# Patient Record
Sex: Female | Born: 1970 | Race: Black or African American | Hispanic: No | Marital: Married | Smoking: Never smoker
Health system: Southern US, Community
[De-identification: ages and names within clinical notes are randomized; demographics above are authoritative.]

---

## 2010-09-04 ENCOUNTER — Ambulatory Visit: Payer: Self-pay | Admitting: Internal Medicine

## 2015-07-03 DIAGNOSIS — E0500 Thyrotoxicosis with diffuse goiter without thyrotoxic crisis or storm: Secondary | ICD-10-CM

## 2015-07-03 HISTORY — DX: Thyrotoxicosis with diffuse goiter without thyrotoxic crisis or storm: E05.00

## 2019-05-13 ENCOUNTER — Other Ambulatory Visit: Payer: Self-pay | Admitting: Family Medicine

## 2019-05-13 DIAGNOSIS — N6320 Unspecified lump in the left breast, unspecified quadrant: Secondary | ICD-10-CM

## 2019-05-14 ENCOUNTER — Other Ambulatory Visit: Payer: Self-pay | Admitting: Family Medicine

## 2019-05-14 DIAGNOSIS — N6320 Unspecified lump in the left breast, unspecified quadrant: Secondary | ICD-10-CM

## 2019-06-01 ENCOUNTER — Ambulatory Visit
Admission: RE | Admit: 2019-06-01 | Discharge: 2019-06-01 | Disposition: A | Discharge status: Home / Self Care | Payer: BC Managed Care – PPO | Source: Ambulatory Visit | Attending: Family Medicine | Admitting: Family Medicine

## 2019-06-01 DIAGNOSIS — N6320 Unspecified lump in the left breast, unspecified quadrant: Secondary | ICD-10-CM

## 2020-08-19 ENCOUNTER — Other Ambulatory Visit: Payer: Self-pay | Admitting: Infectious Diseases

## 2020-08-19 DIAGNOSIS — Z1231 Encounter for screening mammogram for malignant neoplasm of breast: Secondary | ICD-10-CM

## 2020-09-09 ENCOUNTER — Other Ambulatory Visit: Payer: Self-pay

## 2020-09-09 ENCOUNTER — Ambulatory Visit
Admission: RE | Admit: 2020-09-09 | Discharge: 2020-09-09 | Disposition: A | Discharge status: Home / Self Care | Payer: BC Managed Care – PPO | Source: Ambulatory Visit | Attending: Infectious Diseases | Admitting: Infectious Diseases

## 2020-09-09 DIAGNOSIS — Z1231 Encounter for screening mammogram for malignant neoplasm of breast: Secondary | ICD-10-CM | POA: Insufficient documentation

## 2020-11-09 ENCOUNTER — Other Ambulatory Visit: Payer: Self-pay | Admitting: Infectious Diseases

## 2020-11-09 DIAGNOSIS — D509 Iron deficiency anemia, unspecified: Secondary | ICD-10-CM

## 2020-11-09 DIAGNOSIS — R197 Diarrhea, unspecified: Secondary | ICD-10-CM

## 2020-11-14 ENCOUNTER — Other Ambulatory Visit: Payer: Self-pay

## 2020-11-14 ENCOUNTER — Ambulatory Visit
Admission: RE | Admit: 2020-11-14 | Discharge: 2020-11-14 | Disposition: A | Discharge status: Home / Self Care | Payer: BC Managed Care – PPO | Source: Ambulatory Visit | Attending: Infectious Diseases | Admitting: Infectious Diseases

## 2020-11-14 DIAGNOSIS — D509 Iron deficiency anemia, unspecified: Secondary | ICD-10-CM | POA: Insufficient documentation

## 2020-11-14 DIAGNOSIS — R197 Diarrhea, unspecified: Secondary | ICD-10-CM | POA: Diagnosis not present

## 2020-11-14 MED ORDER — IOHEXOL 300 MG/ML  SOLN
80.0000 mL | Freq: Once | INTRAMUSCULAR | Status: AC | PRN
  Administered 2020-11-14: 80 mL via INTRAVENOUS

## 2021-01-03 IMAGING — US US BREAST*R* LIMITED INC AXILLA
1 series · 6 of 6 positions shown · non-contrast
Comparison: None.

CLINICAL DATA: Patient complains of palpable abnormalities in both
breast.

EXAM:
DIGITAL DIAGNOSTIC BILATERAL MAMMOGRAM WITH CAD AND TOMO
ULTRASOUND BILATERAL BREAST

[Series 1: us breast*right* limited inc axilla · 0.05mm/px · 6 of 6 slices shown]
[im 1/6]
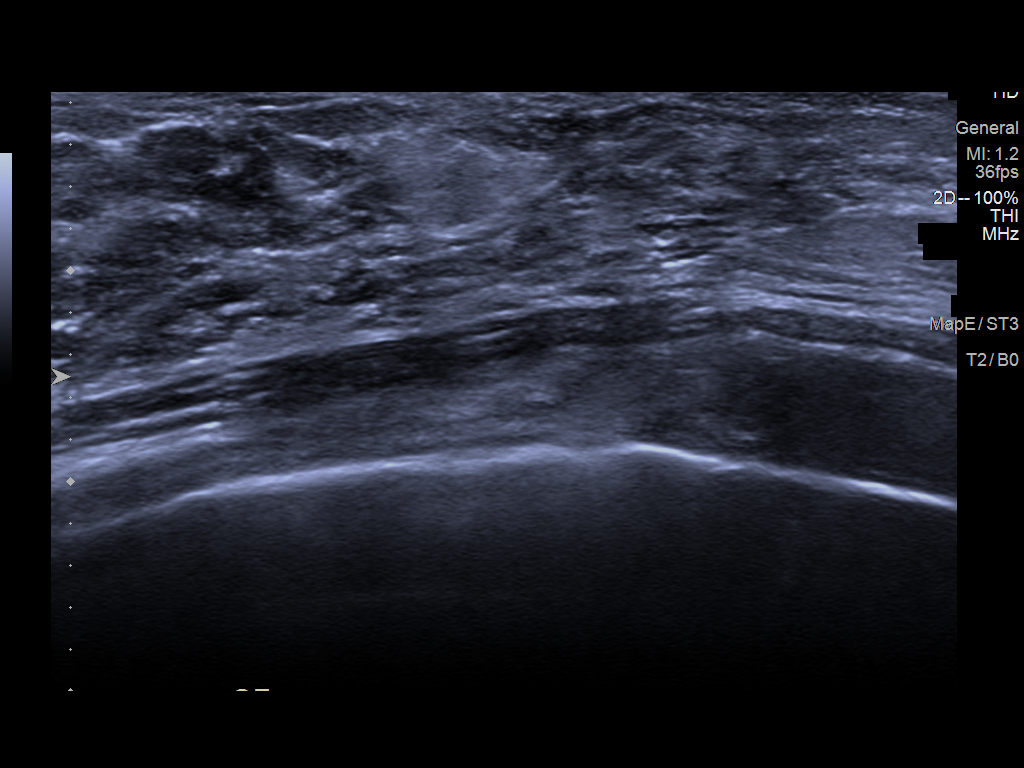
[im 2/6]
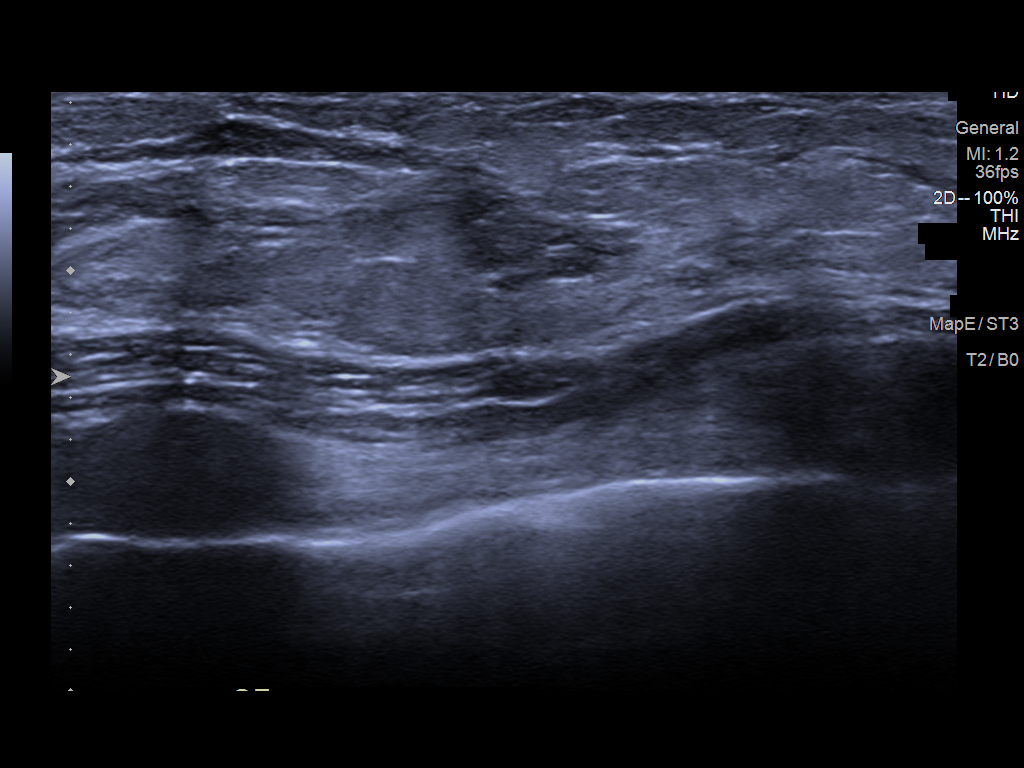
[im 3/6]
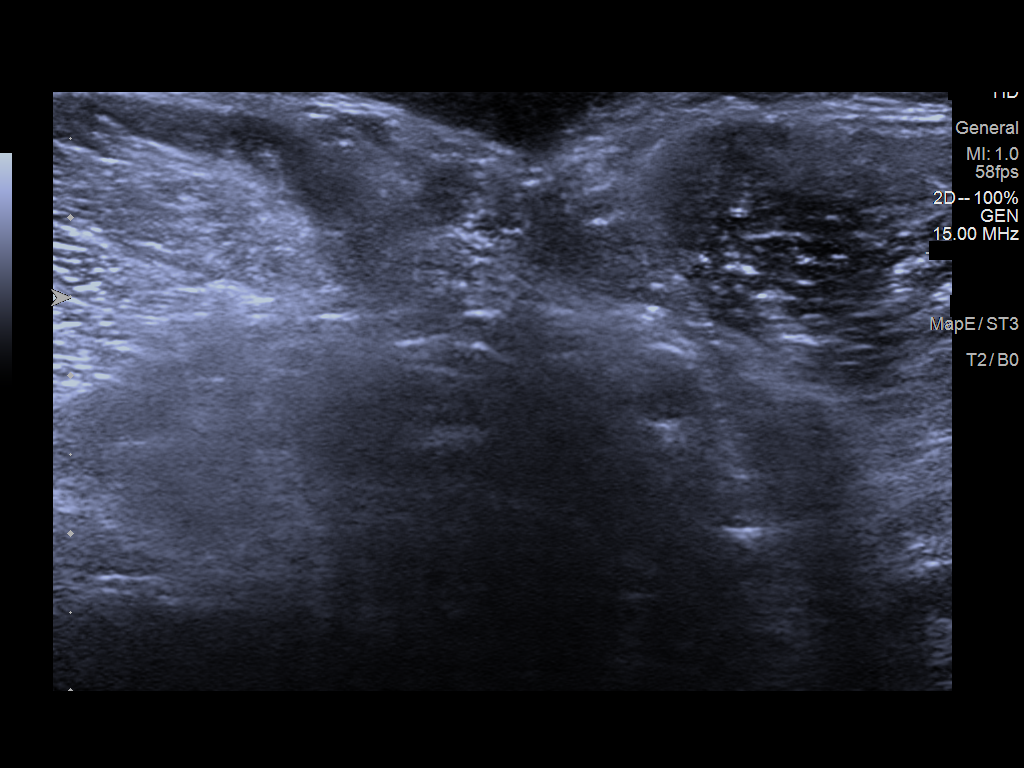
[im 4/6]
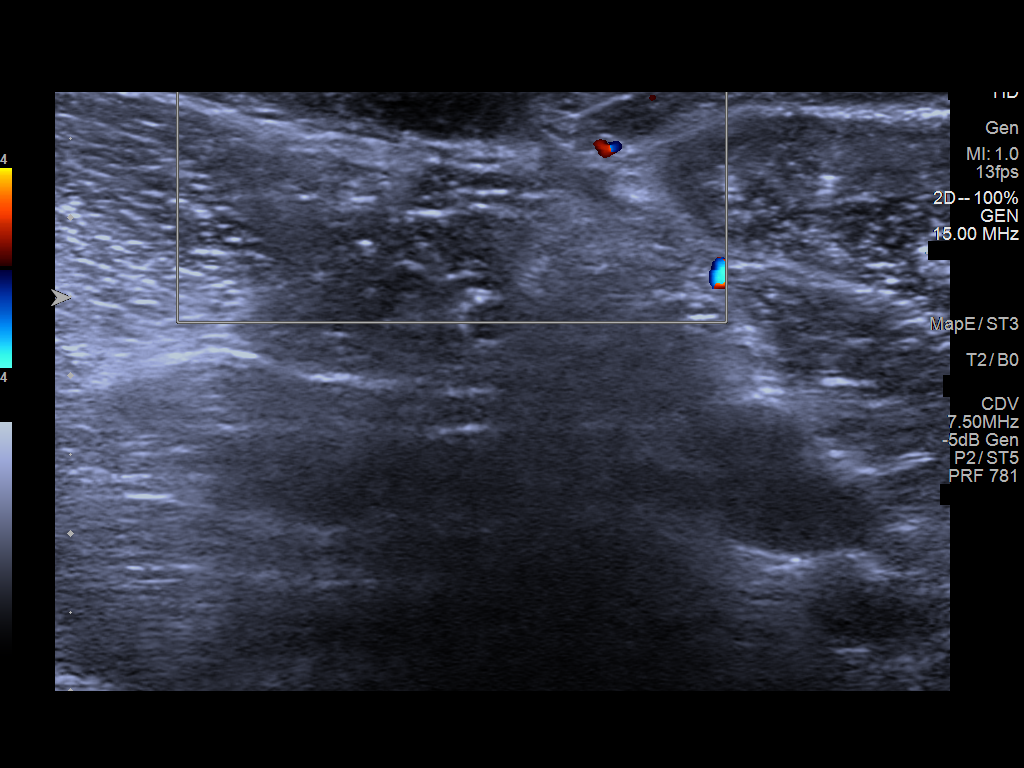
[im 5/6]
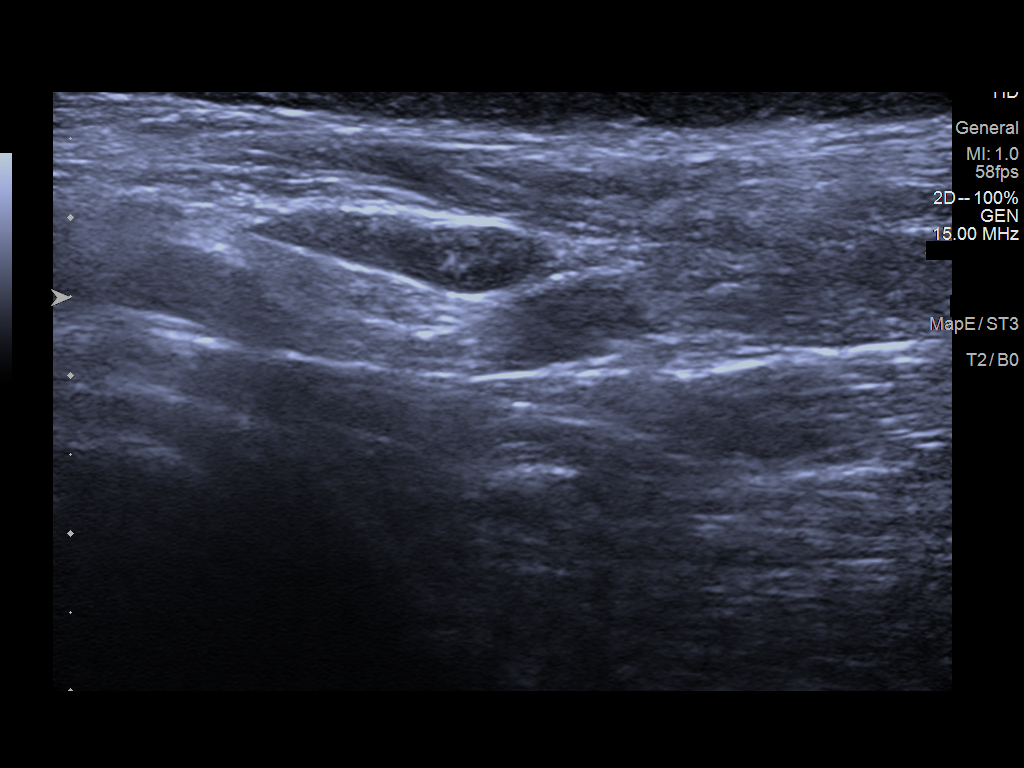
[im 6/6]
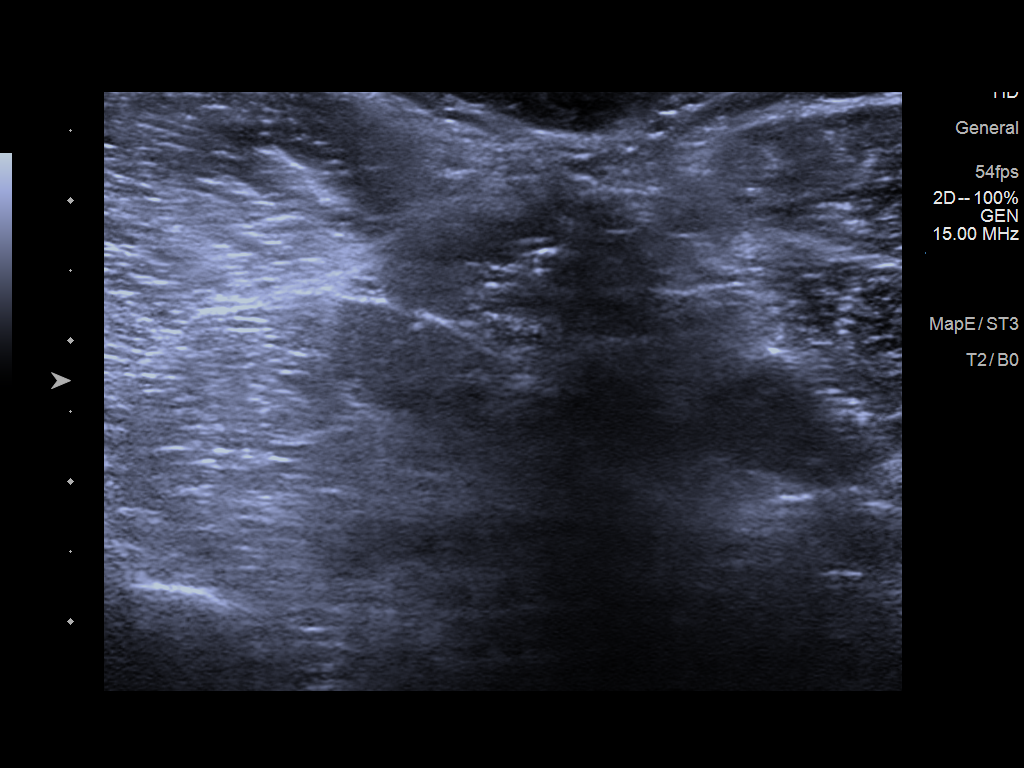

[6 of 6 positions shown; findings below may reference images not displayed]

ACR Breast Density Category d: The breast tissue is extremely dense,
which lowers the sensitivity of mammography.
FINDINGS: There is focal asymmetry in the right axilla seen on the MLO view.
Spot tangential view of this area shows no suspicious mass in the
right axilla. No suspicious mass or malignant type
microcalcifications seen in either breast.

Mammographic images were processed with CAD.

On physical exam, the patient has a fat pad in the right axilla. No
palpable mass is felt. I do not palpate a mass in the area of
clinical concern in the 4 o'clock region of the right breast or the
2 o'clock region of the left breast.

Targeted ultrasound is performed, showing normal tissue in the right
axilla. There is no enlarged adenopathy. Normal tissue is also seen
in the 4 o'clock region of the right breast 6 cm from the nipple and
in the left breast at 2 o'clock 4 cm from the nipple.
IMPRESSION: No evidence of malignancy in either breast.

RECOMMENDATION:
Bilateral screening mammogram in 1 year is recommended.

I have discussed the findings and recommendations with the patient.
If applicable, a reminder letter will be sent to the patient
regarding the next appointment.

BI-RADS CATEGORY  1: Negative.

## 2021-01-03 IMAGING — MG DIGITAL DIAGNOSTIC BILAT W/ TOMO W/ CAD
8 of 14 series · 8 of 40 positions shown · non-contrast
Comparison: None.

CLINICAL DATA: Patient complains of palpable abnormalities in both
breast.

EXAM:
DIGITAL DIAGNOSTIC BILATERAL MAMMOGRAM WITH CAD AND TOMO
ULTRASOUND BILATERAL BREAST

[L TAN synth-2D]
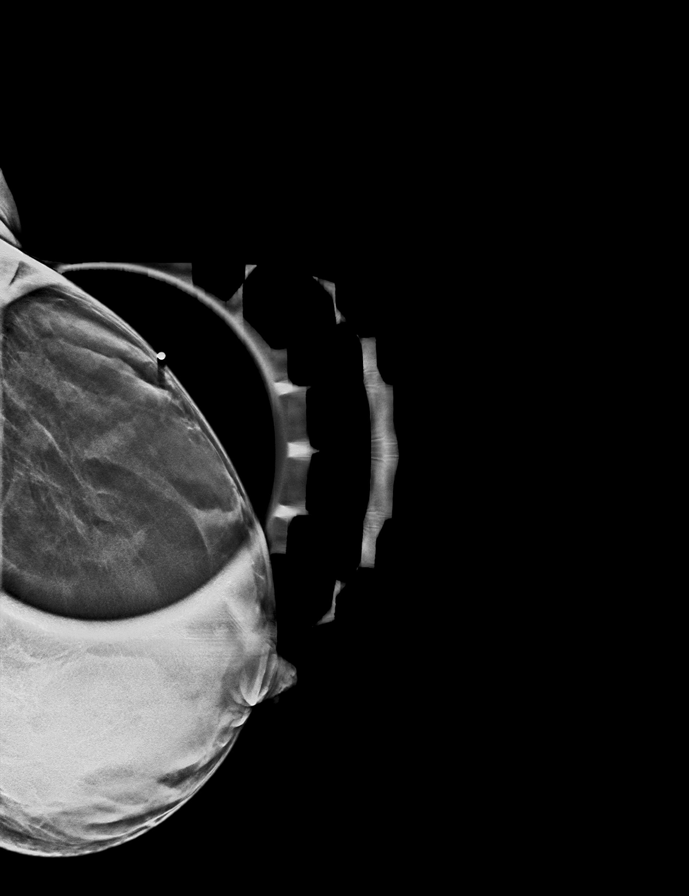

[R MLO synth-2D (1 of 2)]
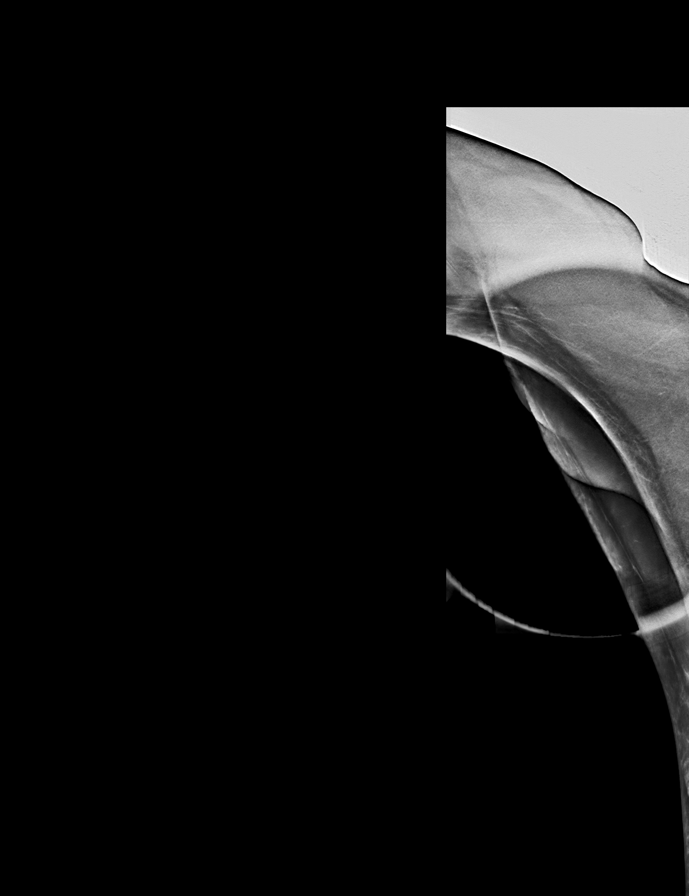

[L MLO synth-2D]
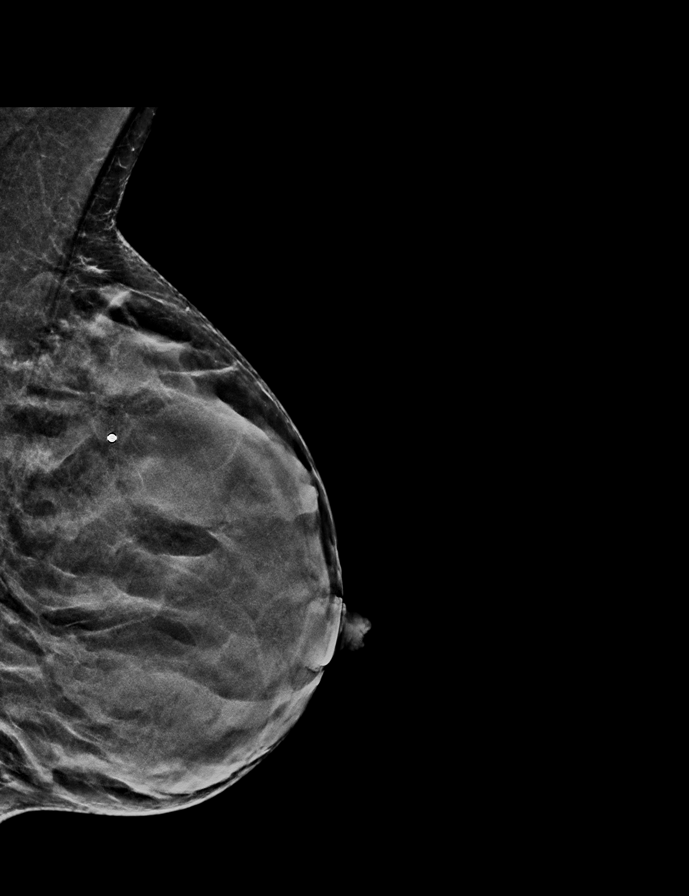

[R MLO synth-2D (2 of 2)]
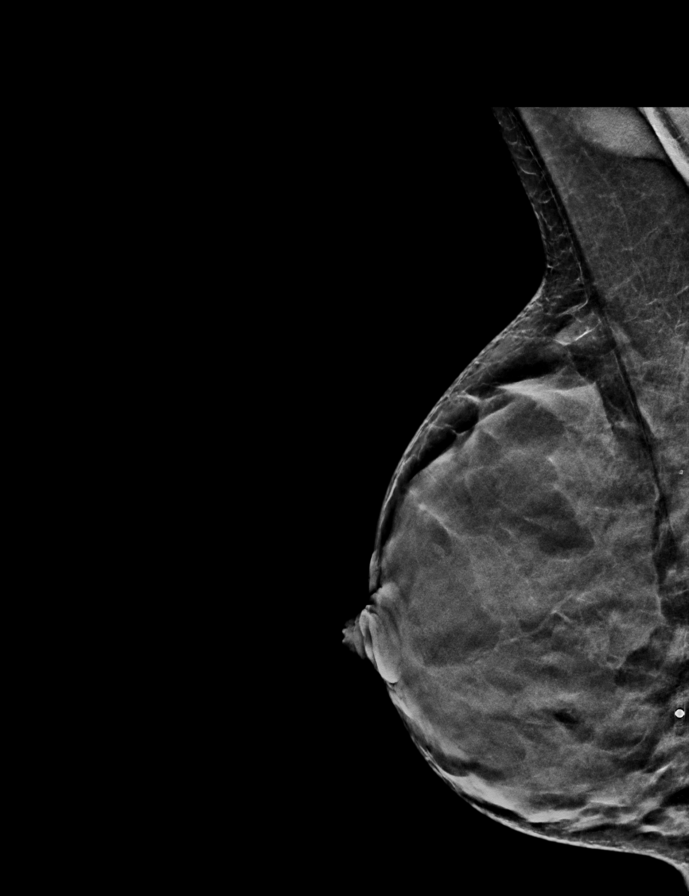

[L CC synth-2D]
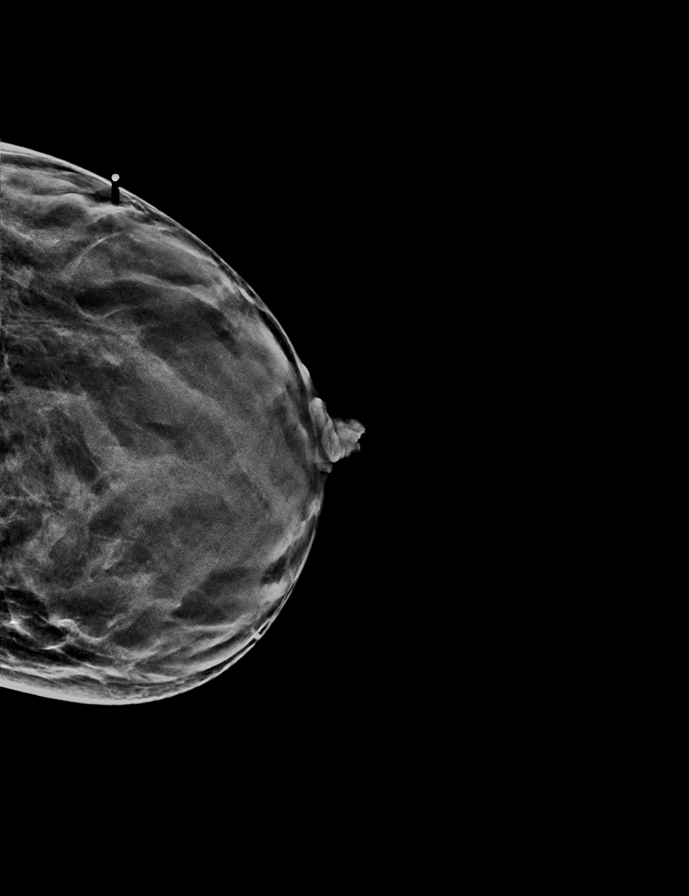

[R CC synth-2D]
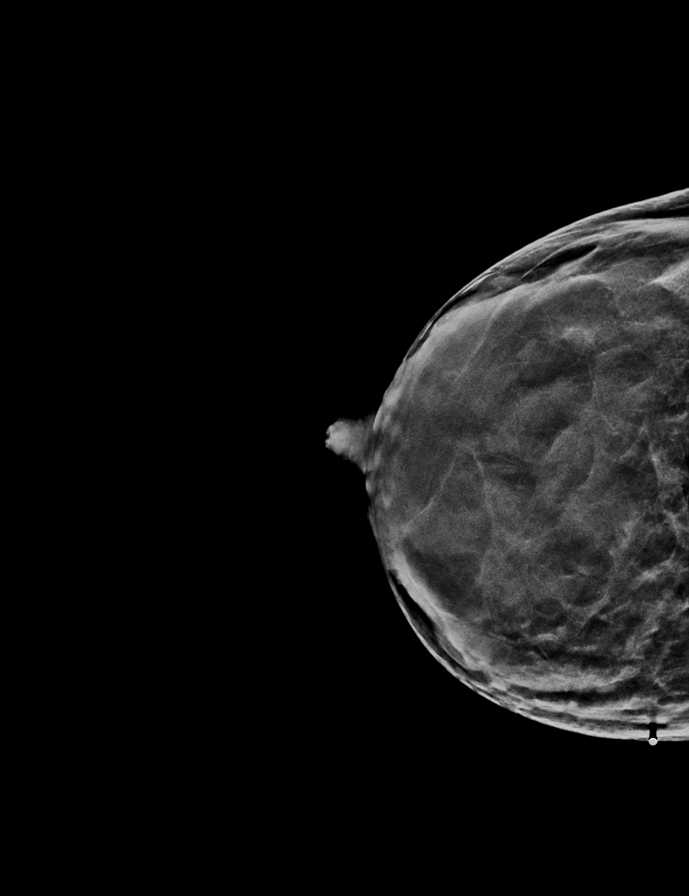

[R TAN synth-2D]
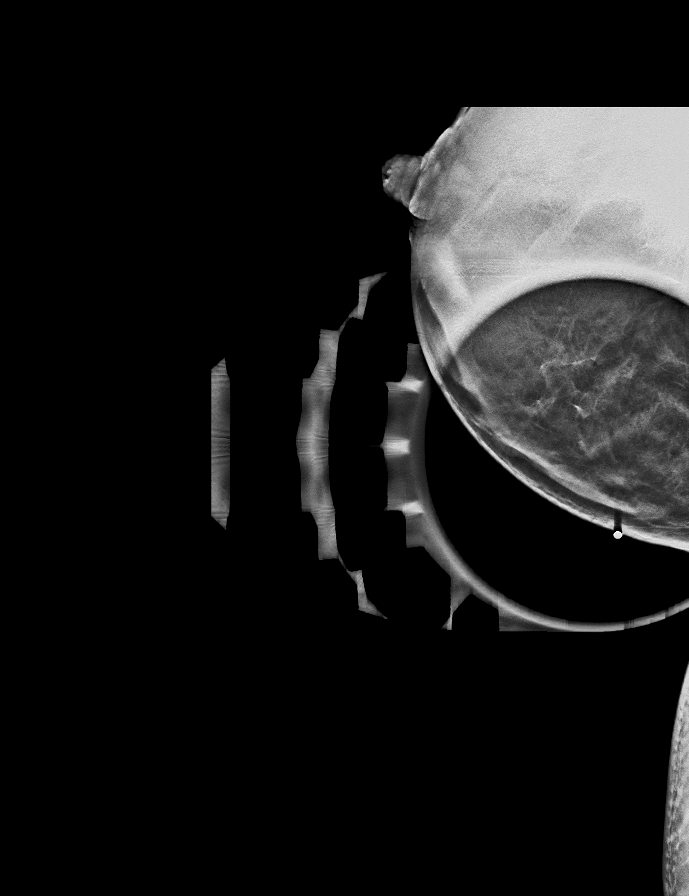

[L MLO tomo · tomo slice 23/45.0]
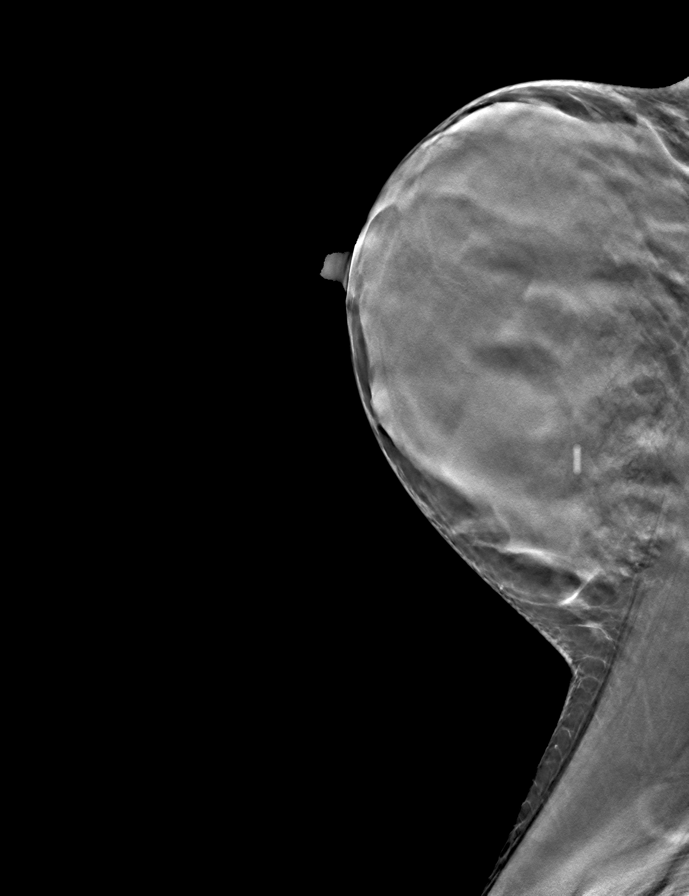

[8 of 40 positions shown; findings below may reference images not displayed]

ACR Breast Density Category d: The breast tissue is extremely dense,
which lowers the sensitivity of mammography.
FINDINGS: There is focal asymmetry in the right axilla seen on the MLO view.
Spot tangential view of this area shows no suspicious mass in the
right axilla. No suspicious mass or malignant type
microcalcifications seen in either breast.

Mammographic images were processed with CAD.

On physical exam, the patient has a fat pad in the right axilla. No
palpable mass is felt. I do not palpate a mass in the area of
clinical concern in the 4 o'clock region of the right breast or the
2 o'clock region of the left breast.

Targeted ultrasound is performed, showing normal tissue in the right
axilla. There is no enlarged adenopathy. Normal tissue is also seen
in the 4 o'clock region of the right breast 6 cm from the nipple and
in the left breast at 2 o'clock 4 cm from the nipple.
IMPRESSION: No evidence of malignancy in either breast.

RECOMMENDATION:
Bilateral screening mammogram in 1 year is recommended.

I have discussed the findings and recommendations with the patient.
If applicable, a reminder letter will be sent to the patient
regarding the next appointment.

BI-RADS CATEGORY  1: Negative.

## 2021-01-03 IMAGING — US US BREAST*L* LIMITED INC AXILLA
1 series · 2 of 2 positions shown · non-contrast
Comparison: None.

CLINICAL DATA: Patient complains of palpable abnormalities in both
breast.

EXAM:
DIGITAL DIAGNOSTIC BILATERAL MAMMOGRAM WITH CAD AND TOMO
ULTRASOUND BILATERAL BREAST

[Series 1: us breast*left* limited inc axilla · 0.07mm/px · 2 of 2 slices shown]
[im 1/2]
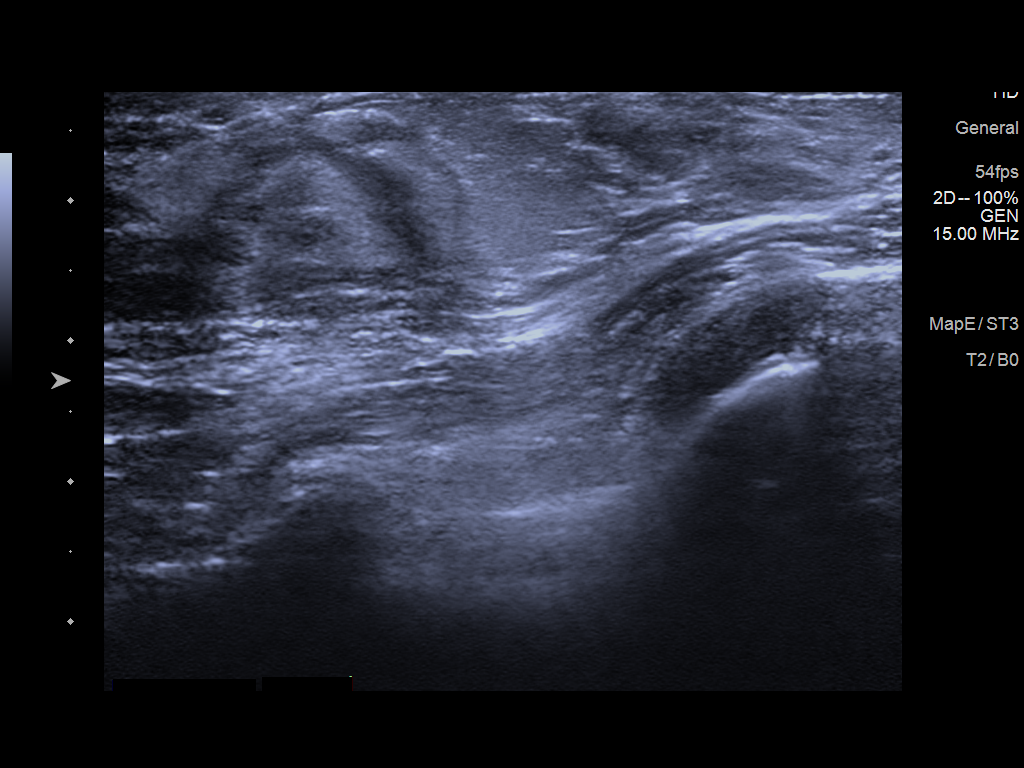
[im 2/2]
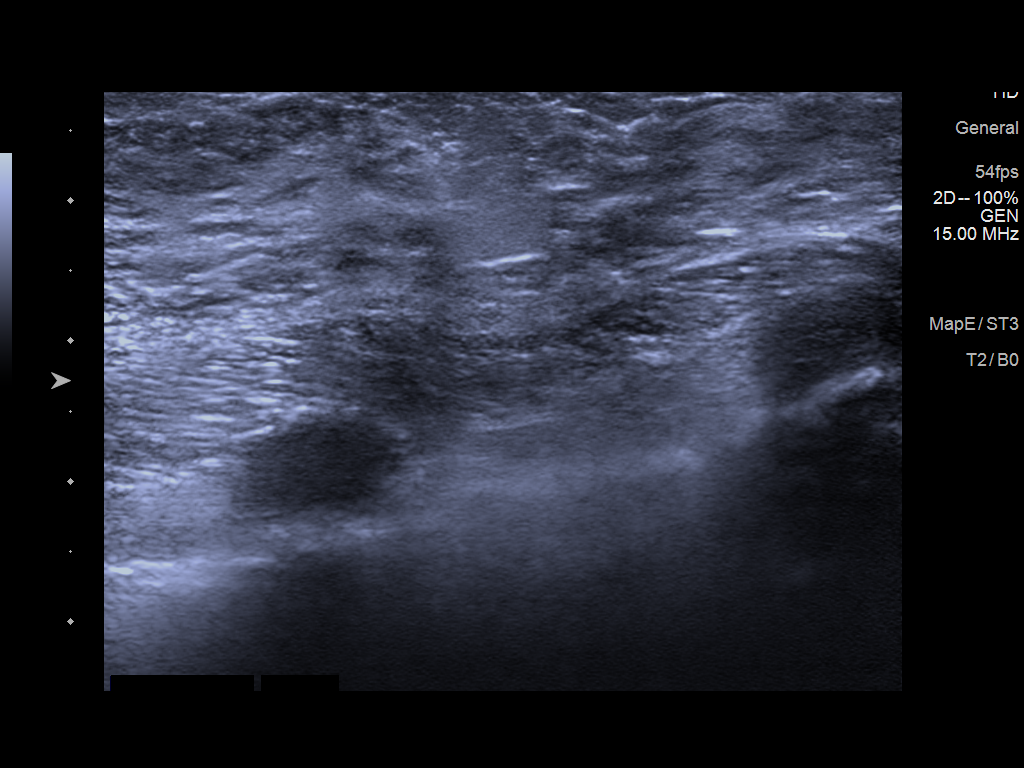

[2 of 2 positions shown; findings below may reference images not displayed]

ACR Breast Density Category d: The breast tissue is extremely dense,
which lowers the sensitivity of mammography.
FINDINGS: There is focal asymmetry in the right axilla seen on the MLO view.
Spot tangential view of this area shows no suspicious mass in the
right axilla. No suspicious mass or malignant type
microcalcifications seen in either breast.

Mammographic images were processed with CAD.

On physical exam, the patient has a fat pad in the right axilla. No
palpable mass is felt. I do not palpate a mass in the area of
clinical concern in the 4 o'clock region of the right breast or the
2 o'clock region of the left breast.

Targeted ultrasound is performed, showing normal tissue in the right
axilla. There is no enlarged adenopathy. Normal tissue is also seen
in the 4 o'clock region of the right breast 6 cm from the nipple and
in the left breast at 2 o'clock 4 cm from the nipple.
IMPRESSION: No evidence of malignancy in either breast.

RECOMMENDATION:
Bilateral screening mammogram in 1 year is recommended.

I have discussed the findings and recommendations with the patient.
If applicable, a reminder letter will be sent to the patient
regarding the next appointment.

BI-RADS CATEGORY  1: Negative.

## 2021-08-04 ENCOUNTER — Other Ambulatory Visit: Payer: Self-pay | Admitting: Infectious Diseases

## 2021-08-04 DIAGNOSIS — Z1231 Encounter for screening mammogram for malignant neoplasm of breast: Secondary | ICD-10-CM

## 2021-09-11 ENCOUNTER — Ambulatory Visit
Admission: RE | Admit: 2021-09-11 | Discharge: 2021-09-11 | Disposition: A | Discharge status: Home / Self Care | Payer: BC Managed Care – PPO | Source: Ambulatory Visit | Attending: Infectious Diseases | Admitting: Infectious Diseases

## 2021-09-11 ENCOUNTER — Other Ambulatory Visit: Payer: Self-pay

## 2021-09-11 DIAGNOSIS — Z1231 Encounter for screening mammogram for malignant neoplasm of breast: Secondary | ICD-10-CM | POA: Diagnosis present

## 2022-02-02 ENCOUNTER — Other Ambulatory Visit
Admission: RE | Admit: 2022-02-02 | Discharge: 2022-02-02 | Disposition: A | Discharge status: Home / Self Care | Payer: BC Managed Care – PPO | Source: Ambulatory Visit | Attending: Infectious Diseases | Admitting: Infectious Diseases

## 2022-02-02 DIAGNOSIS — R600 Localized edema: Secondary | ICD-10-CM | POA: Insufficient documentation

## 2022-02-02 DIAGNOSIS — R197 Diarrhea, unspecified: Secondary | ICD-10-CM | POA: Diagnosis present

## 2022-02-02 DIAGNOSIS — D509 Iron deficiency anemia, unspecified: Secondary | ICD-10-CM | POA: Insufficient documentation

## 2022-02-02 DIAGNOSIS — E0500 Thyrotoxicosis with diffuse goiter without thyrotoxic crisis or storm: Secondary | ICD-10-CM | POA: Diagnosis present

## 2022-02-02 LAB — BRAIN NATRIURETIC PEPTIDE: B Natriuretic Peptide: 113.9 pg/mL — ABNORMAL HIGH (ref 0.0–100.0)

## 2022-02-14 ENCOUNTER — Inpatient Hospital Stay: Payer: BC Managed Care – PPO | Attending: Internal Medicine | Admitting: Internal Medicine

## 2022-02-14 ENCOUNTER — Encounter: Payer: Self-pay | Admitting: Internal Medicine

## 2022-02-14 ENCOUNTER — Inpatient Hospital Stay: Payer: BC Managed Care – PPO

## 2022-02-14 DIAGNOSIS — D649 Anemia, unspecified: Secondary | ICD-10-CM | POA: Insufficient documentation

## 2022-02-14 DIAGNOSIS — Z803 Family history of malignant neoplasm of breast: Secondary | ICD-10-CM | POA: Insufficient documentation

## 2022-02-14 DIAGNOSIS — N939 Abnormal uterine and vaginal bleeding, unspecified: Secondary | ICD-10-CM | POA: Diagnosis not present

## 2022-02-14 DIAGNOSIS — E611 Iron deficiency: Secondary | ICD-10-CM | POA: Diagnosis not present

## 2022-02-14 DIAGNOSIS — R1310 Dysphagia, unspecified: Secondary | ICD-10-CM | POA: Diagnosis not present

## 2022-02-14 DIAGNOSIS — Z801 Family history of malignant neoplasm of trachea, bronchus and lung: Secondary | ICD-10-CM | POA: Diagnosis not present

## 2022-02-14 DIAGNOSIS — M7989 Other specified soft tissue disorders: Secondary | ICD-10-CM | POA: Insufficient documentation

## 2022-02-14 NOTE — Progress Notes (Signed)
[place] NOTE  Patient Care Team: Leonel Ramsay, MD as PCP - General (Infectious Diseases)  CHIEF COMPLAINTS/PURPOSE OF CONSULTATION: ANEMIA   HEMATOLOGY HISTORY  # ANEMIA [JULY 2023- Hb;11.7;platelets- WBC; Iron sat; ferritin; 4 GFR- WNL; CT/US- ;  EGD/colonoscopy-[none- concerns of anesthesia]  HISTORY OF PRESENTING ILLNESS: Alone.  Ambulating independently.  Ellen Contreras 50 y.o.  female pleasant patient was been referred to [place] for further evaluation of anemia.  Patient history of longstanding heavy menstrual cycles.  In the recent months is not as heavy as it used to be.   Patient has difficulty swallowing pills [cannot just swallow].  Blood in stools: none; cologard negative Blood in urine: none Difficulty swallowing: none [? psyche] Change of bowel movement/constipation: chronic diarrhea- ? IBS Prior blood transfusion:none Liver disease: none Alcohol: none Bariatric surgery:none  Vaginal bleeding: heavy [3-4 days; not heavy as they used be in past] Prior evaluation with hematology: none Prior bone marrow biopsy:  none Oral iron: poor tolerance; cant swallow pills Prior IV iron infusions: none   Review of Systems  Constitutional:  Positive for chills. Negative for diaphoresis, fever and weight loss.  HENT:  Negative for nosebleeds and sore throat.   Eyes:  Negative for double vision.  Respiratory:  Negative for cough, hemoptysis, sputum production, shortness of breath and wheezing.   Cardiovascular:  Negative for chest pain, palpitations, orthopnea and leg swelling.  Gastrointestinal:  Negative for abdominal pain, blood in stool, constipation, diarrhea, heartburn, melena, nausea and vomiting.  Genitourinary:  Negative for dysuria, frequency and urgency.  Musculoskeletal:  Negative for back pain and joint pain.  Skin: Negative.  Negative for itching and rash.  Neurological:  Negative for dizziness, tingling, focal weakness, weakness and  headaches.  Endo/Heme/Allergies:  Does not bruise/bleed easily.  Psychiatric/Behavioral:  Negative for depression. The patient is not nervous/anxious and does not have insomnia.      MEDICAL HISTORY:  Past Medical History:  Diagnosis Date   Graves disease 2017    SURGICAL HISTORY: History reviewed. No pertinent surgical history.  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 Never smoked; alcohol.    Social Determinants of Health   Financial Resource Strain: Not on file  Food Insecurity: Not on file  Transportation Needs: Not on file  Physical Activity: Not on file  Stress: Not on file  Social Connections: Not on file  Intimate Partner Violence: Not on file    FAMILY HISTORY: Family History  Problem Relation Age of Onset   Healthy Mother    Lung cancer Father        2020   Healthy Sister    Healthy Brother    Breast cancer Cousin     ALLERGIES:  is allergic to sulfa antibiotics.  MEDICATIONS:  Current Outpatient  Medications  Medication Sig Dispense Refill   ethacrynic acid (EDECRIN) 25 MG tablet Take by mouth.     meloxicam (MOBIC) 15 MG tablet Take 15 mg by mouth daily.     methimazole (TAPAZOLE) 5 MG tablet Take 2.5 mg by mouth daily.     No current facility-administered medications for this visit.     Marland Kitchen  PHYSICAL EXAMINATION:   Vitals:   02/14/22 1122  BP: (!) 165/81  Pulse: 73   Temp: 98.4 F (36.9 C)  SpO2: 100%   Filed Weights   02/14/22 1122  Weight: 121 lb 9.6 oz (55.2 kg)    Physical Exam Vitals and nursing note reviewed.  HENT:     Head: Normocephalic and atraumatic.     Mouth/Throat:     Pharynx: Oropharynx is clear.  Eyes:     Extraocular Movements: Extraocular movements intact.     Pupils: Pupils are equal, round, and reactive to light.  Cardiovascular:     Rate and Rhythm: Normal rate and regular rhythm.  Pulmonary:     Comments: Decreased breath sounds bilaterally.  Abdominal:     Palpations: Abdomen is soft.  Musculoskeletal:        General: Normal range of motion.     Cervical back: Normal range of motion.  Skin:    General: Skin is warm.  Neurological:     General: No focal deficit present.     Mental Status: She is alert and oriented to person, place, and time.  Psychiatric:        Behavior: Behavior normal.        Judgment: Judgment normal.      LABORATORY DATA:  I have reviewed the data as listed No results found for: "WBC", "HGB", "HCT", "MCV", "PLT" No results for input(s): "NA", "K", "CL", "CO2", "GLUCOSE", "BUN", "CREATININE", "CALCIUM", "GFRNONAA", "GFRAA", "PROT", "ALBUMIN", "AST", "ALT", "ALKPHOS", "BILITOT", "BILIDIR", "IBILI" in the last 8760 hours.   No results found.  ASSESSMENT & PLAN:   Iron deficiency # JULY 2023- Hb 11.7; Ferritin- 4 [PCP; [facility]]; "cold intolerance"; given the patient's severe iron deficiency; symptoms would recommend proceeding with iron infusion.  Patient has poor tolerance to oral iron/difficulty swallowing.  I discussed the potential acute infusion reactions with IV iron; which are quite rare.  Patient understands the risk; will proceed with infusions.  # ETIOLOGY: suspect menstrual cycles; however recommend GI evaluation [s/p [facility]] re: dysphagia.  Discussed the importance of screening colonoscopy given he age.   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  Stable.  Await evaluation with cardiology.  # Graves [on methimazole]; Dr.O'Connell-stable.  Thank you Dr.Fitzgerald for allowing me to participate in the care of your pleasant patient. Please do not hesitate to contact me with questions or concerns in the interim.  # DISPOSITION: # no labs today # venofer weekly x3-start next week  # follow up in 2 months- MD; labs- cbc/bmp; iron studies; ferrittin; possible venofer- dr.B    All questions were answered. The patient knows to call the clinic with any problems, questions or concerns.    Cammie Sickle, MD 02/14/2022 1:06 PM

## 2022-02-14 NOTE — Assessment & Plan Note (Addendum)
#   JULY 2023- Hb 11.7; Ferritin- 4 [PCP; [facility]]; "cold intolerance"; given the patient's severe iron deficiency; symptoms would recommend proceeding with iron infusion.  Patient has poor tolerance to oral iron/difficulty swallowing.  I discussed the potential acute infusion reactions with IV iron; which are quite rare.  Patient understands the risk; will proceed with infusions.  # ETIOLOGY: suspect menstrual cycles; however recommend GI evaluation [s/p [facility]] re: dysphagia.  Discussed the importance of screening colonoscopy given he age.   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  Stable.  Await evaluation with cardiology.  # Graves [on methimazole]; Dr.O'Connell-stable.  Thank you Dr.Fitzgerald for allowing me to participate in the care of your pleasant patient. Please do not hesitate to contact me with questions or concerns in the interim.  # DISPOSITION: # no labs today # venofer weekly x3-start next week  # follow up in 2 months- MD; labs- cbc/bmp; iron studies; ferrittin; possible venofer- dr.B

## 2022-02-21 ENCOUNTER — Inpatient Hospital Stay: Payer: BC Managed Care – PPO

## 2022-02-21 VITALS — BP 149/73 | HR 69 | Temp 97.0°F | Resp 18

## 2022-02-21 DIAGNOSIS — E611 Iron deficiency: Secondary | ICD-10-CM

## 2022-02-21 MED ORDER — SODIUM CHLORIDE 0.9 % IV SOLN
200.0000 mg | Freq: Once | INTRAVENOUS | Status: AC
  Administered 2022-02-21: 200 mg via INTRAVENOUS
  Filled 2022-02-21: qty 200

## 2022-02-21 MED ORDER — SODIUM CHLORIDE 0.9 % IV SOLN
Freq: Once | INTRAVENOUS | Status: AC
  Filled 2022-02-21: qty 250

## 2022-02-27 ENCOUNTER — Inpatient Hospital Stay: Payer: BC Managed Care – PPO

## 2022-02-27 VITALS — BP 145/62 | HR 66 | Temp 98.7°F | Resp 18

## 2022-02-27 DIAGNOSIS — E611 Iron deficiency: Secondary | ICD-10-CM

## 2022-02-27 MED ORDER — SODIUM CHLORIDE 0.9 % IV SOLN
Freq: Once | INTRAVENOUS | Status: AC
  Filled 2022-02-27: qty 250

## 2022-02-27 MED ORDER — SODIUM CHLORIDE 0.9 % IV SOLN
200.0000 mg | Freq: Once | INTRAVENOUS | Status: AC
  Administered 2022-02-27: 200 mg via INTRAVENOUS
  Filled 2022-02-27: qty 200

## 2022-02-28 ENCOUNTER — Ambulatory Visit: Payer: BC Managed Care – PPO

## 2022-03-07 ENCOUNTER — Inpatient Hospital Stay: Payer: BC Managed Care – PPO | Attending: Internal Medicine

## 2022-03-07 VITALS — BP 158/73 | HR 65 | Temp 97.6°F | Resp 16

## 2022-03-07 DIAGNOSIS — E611 Iron deficiency: Secondary | ICD-10-CM | POA: Insufficient documentation

## 2022-03-07 DIAGNOSIS — Z79899 Other long term (current) drug therapy: Secondary | ICD-10-CM | POA: Diagnosis not present

## 2022-03-07 MED ORDER — SODIUM CHLORIDE 0.9 % IV SOLN
200.0000 mg | Freq: Once | INTRAVENOUS | Status: AC
  Administered 2022-03-07: 200 mg via INTRAVENOUS
  Filled 2022-03-07: qty 200

## 2022-03-07 MED ORDER — SODIUM CHLORIDE 0.9 % IV SOLN
Freq: Once | INTRAVENOUS | Status: AC
  Filled 2022-03-07: qty 250

## 2022-04-16 ENCOUNTER — Inpatient Hospital Stay (HOSPITAL_BASED_OUTPATIENT_CLINIC_OR_DEPARTMENT_OTHER): Payer: BC Managed Care – PPO | Admitting: Internal Medicine

## 2022-04-16 ENCOUNTER — Inpatient Hospital Stay: Payer: BC Managed Care – PPO

## 2022-04-16 ENCOUNTER — Inpatient Hospital Stay: Payer: BC Managed Care – PPO | Attending: Internal Medicine

## 2022-04-16 ENCOUNTER — Encounter: Payer: Self-pay | Admitting: Internal Medicine

## 2022-04-16 VITALS — BP 143/60 | HR 58 | Resp 16

## 2022-04-16 DIAGNOSIS — Z803 Family history of malignant neoplasm of breast: Secondary | ICD-10-CM | POA: Diagnosis not present

## 2022-04-16 DIAGNOSIS — M7989 Other specified soft tissue disorders: Secondary | ICD-10-CM | POA: Diagnosis not present

## 2022-04-16 DIAGNOSIS — E0500 Thyrotoxicosis with diffuse goiter without thyrotoxic crisis or storm: Secondary | ICD-10-CM | POA: Diagnosis not present

## 2022-04-16 DIAGNOSIS — D500 Iron deficiency anemia secondary to blood loss (chronic): Secondary | ICD-10-CM | POA: Insufficient documentation

## 2022-04-16 DIAGNOSIS — E876 Hypokalemia: Secondary | ICD-10-CM | POA: Insufficient documentation

## 2022-04-16 DIAGNOSIS — E611 Iron deficiency: Secondary | ICD-10-CM

## 2022-04-16 DIAGNOSIS — Z801 Family history of malignant neoplasm of trachea, bronchus and lung: Secondary | ICD-10-CM | POA: Insufficient documentation

## 2022-04-16 LAB — BASIC METABOLIC PANEL
Anion gap: 6 (ref 5–15)
BUN: 5 mg/dL — ABNORMAL LOW (ref 6–20)
CO2: 25 mmol/L (ref 22–32)
Calcium: 8.8 mg/dL — ABNORMAL LOW (ref 8.9–10.3)
Chloride: 109 mmol/L (ref 98–111)
Creatinine, Ser: 0.67 mg/dL (ref 0.44–1.00)
GFR, Estimated: >60 mL/min (ref >60)
Glucose, Bld: 89 mg/dL (ref 70–99)
Potassium: 2.9 mmol/L — ABNORMAL LOW (ref 3.5–5.1)
Sodium: 140 mmol/L (ref 135–145)

## 2022-04-16 LAB — CBC WITH DIFFERENTIAL/PLATELET
Abs Immature Granulocytes: 0 10*3/uL (ref 0.00–0.07)
Basophils Absolute: 0 10*3/uL (ref 0.0–0.1)
Basophils Relative: 0 %
Eosinophils Absolute: 0 10*3/uL (ref 0.0–0.5)
Eosinophils Relative: 1 %
HCT: 34.1 % — ABNORMAL LOW (ref 36.0–46.0)
Hemoglobin: 11.5 g/dL — ABNORMAL LOW (ref 12.0–15.0)
Immature Granulocytes: 0 %
Lymphocytes Relative: 38 %
Lymphs Abs: 1.9 10*3/uL (ref 0.7–4.0)
MCH: 29.1 pg (ref 26.0–34.0)
MCHC: 33.7 g/dL (ref 30.0–36.0)
MCV: 86.3 fL (ref 80.0–100.0)
Monocytes Absolute: 0.4 10*3/uL (ref 0.1–1.0)
Monocytes Relative: 8 %
Neutro Abs: 2.6 10*3/uL (ref 1.7–7.7)
Neutrophils Relative %: 53 %
Platelets: 176 10*3/uL (ref 150–400)
RBC: 3.95 MIL/uL (ref 3.87–5.11)
RDW: 13.7 % (ref 11.5–15.5)
WBC: 4.9 10*3/uL (ref 4.0–10.5)
nRBC: 0 % (ref 0.0–0.2)

## 2022-04-16 LAB — IRON AND TIBC
Iron: 98 ug/dL (ref 28–170)
Saturation Ratios: 39 % — ABNORMAL HIGH (ref 10.4–31.8)
TIBC: 252 ug/dL (ref 250–450)
UIBC: 154 ug/dL

## 2022-04-16 LAB — FERRITIN: Ferritin: 126 ng/mL (ref 11–307)

## 2022-04-16 MED ORDER — SODIUM CHLORIDE 0.9 % IV SOLN
200.0000 mg | Freq: Once | INTRAVENOUS | Status: AC
  Administered 2022-04-16: 200 mg via INTRAVENOUS
  Filled 2022-04-16: qty 200

## 2022-04-16 MED ORDER — SODIUM CHLORIDE 0.9 % IV SOLN
Freq: Once | INTRAVENOUS | Status: AC
  Filled 2022-04-16: qty 250

## 2022-04-16 NOTE — Progress Notes (Signed)
Patient here today for follow up regarding anemia. Patient denies concerns today.  

## 2022-04-16 NOTE — Patient Instructions (Signed)

## 2022-04-16 NOTE — Progress Notes (Signed)
Pt tolerated iron infusion without complaints.  VSS.  Pt denied 30 minute post obs.

## 2022-04-16 NOTE — Progress Notes (Signed)
[facility] CONSULT NOTE  Patient Care Team: Mick Sell, MD as PCP - General (Infectious Diseases)  CHIEF COMPLAINTS/PURPOSE OF CONSULTATION: ANEMIA   HEMATOLOGY HISTORY  # ANEMIA [JULY 2023- Hb;11.7;platelets- WBC; Iron sat; ferritin; 4 GFR- WNL; CT/US- ;  EGD/colonoscopy-[none- concerns of anesthesia]  HISTORY OF PRESENTING ILLNESS: Alone.  Ambulating independently.  Ellen Contreras 51 y.o.  female pleasant patient with understanding malignancy due to menstrual cycles is here for follow-up.  Patient underwent on infusions-notes to have some improvement of energy levels.  Also recently started on Lasix by PCP.  Unable to take potassium-because of the size.  He denies any cramping in the legs.  Denies any racing heartbeat.    Review of Systems  Constitutional:  Positive for chills. Negative for diaphoresis, fever and weight loss.  HENT:  Negative for nosebleeds and sore throat.   Eyes:  Negative for double vision.  Respiratory:  Negative for cough, hemoptysis, sputum production, shortness of breath and wheezing.   Cardiovascular:  Negative for chest pain, palpitations, orthopnea and leg swelling.  Gastrointestinal:  Negative for abdominal pain, blood in stool, constipation, diarrhea, heartburn, melena, nausea and vomiting.  Genitourinary:  Negative for dysuria, frequency and urgency.  Musculoskeletal:  Negative for back pain and joint pain.  Skin: Negative.  Negative for itching and rash.  Neurological:  Negative for dizziness, tingling, focal weakness, weakness and headaches.  Endo/Heme/Allergies:  Does not bruise/bleed easily.  Psychiatric/Behavioral:  Negative for depression. The patient is not nervous/anxious and does not have insomnia.      MEDICAL HISTORY:  Past Medical History:  Diagnosis Date   Graves disease 2017    SURGICAL HISTORY: History reviewed. No pertinent surgical history.  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 Never smoked; alcohol.    Social Determinants of Health   Financial Resource Strain: Not on file  Food Insecurity: Not on file  Transportation Needs: Not on file  Physical Activity: Not on file  Stress: Not on file  Social Connections: Not on file  Intimate Partner Violence: Not on file    FAMILY HISTORY: Family History  Problem Relation Age of Onset   Healthy Mother    Lung cancer Father        2020   Healthy Sister    Healthy Brother    Breast cancer Cousin     ALLERGIES:  is allergic to sulfa antibiotics.  MEDICATIONS:  Current Outpatient Medications  Medication Sig Dispense Refill   ethacrynic acid (EDECRIN) 25 MG tablet Take by mouth.     meloxicam (MOBIC) 15 MG tablet Take 15 mg by mouth daily.     methimazole (TAPAZOLE) 5 MG tablet Take 2.5 mg by mouth daily.     No current facility-administered medications for this visit.  Facility-Administered Medications Ordered in Other Visits  Medication Dose Route Frequency Provider Last Rate Last Admin   iron sucrose (VENOFER) 200 mg in sodium chloride 0.9 % 100 mL IVPB  200 mg Intravenous Once Charlaine Dalton R, MD 440 mL/hr at 04/16/22 1400 200 mg at 04/16/22 1400     .  PHYSICAL EXAMINATION:   Vitals:   04/16/22 1312  BP: (!) 167/83  Pulse: 62  Resp: 18  Temp: (!) 97.5 F (36.4 C)   Filed Weights   04/16/22 1312  Weight: 118 lb 6.4 oz (53.7 kg)     Physical Exam Vitals and nursing note reviewed.  HENT:     Head: Normocephalic and atraumatic.     Mouth/Throat:     Pharynx: Oropharynx is clear.  Eyes:     Extraocular Movements: Extraocular movements intact.     Pupils: Pupils are equal, round, and reactive to light.  Cardiovascular:     Rate and Rhythm: Normal rate and regular rhythm.  Pulmonary:     Comments: Decreased breath sounds bilaterally.  Abdominal:     Palpations: Abdomen is soft.  Musculoskeletal:        General: Normal range of motion.     Cervical back: Normal range of motion.  Skin:    General: Skin is warm.  Neurological:     General: No focal deficit present.     Mental Status: Ellen Contreras is alert and oriented to person, place, and time.  Psychiatric:        Behavior: Behavior normal.        Judgment: Judgment normal.      LABORATORY DATA:  I have reviewed the data as listed Lab Results  Component Value Date   WBC 4.9 04/16/2022   HGB 11.5 (L) 04/16/2022   HCT 34.1 (L) 04/16/2022   MCV 86.3 04/16/2022   PLT 176 04/16/2022   Recent Labs    04/16/22 1229  NA 140  K 2.9*  CL 109  CO2 25  GLUCOSE 89  BUN 5*  CREATININE 0.67  CALCIUM 8.8*  GFRNONAA >60     No results found.  ASSESSMENT & PLAN:   Iron deficiency # JULY 2023- Hb 11.7; Ferritin- 4 [PCP; [facility]]; "cold intolerance";s/p IV venofer x3; SEP 2023- Ferritin- 304.  Proceed with Venofer as patient is intolerant to oral iron pills; with ongoing menstrual blood loss.  # ETIOLOGY: suspect menstrual cycles; however recommend GI evaluation [s/p [facility]] re: dysphagia.  Again iscussed the importance of screening colonoscopy given he age.   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  See below  # Graves [on methimazole]; Dr.O'Connell-stable.  # Hypokalemia- 2.9-likely secondary to Lasix.   However patient intolerant to oral potassium.  However  currently off Lasix. Discussed regarding dietary options.  Also discussed the importance of taking potassium if Ellen Contreras had to go on Lasix.  Discussed options of liquid potassium-defer to PCP.  # DISPOSITION:  # Venofer today # follow up in 6 months- MD; 2-3 days PRIOR labs- cbc/bmp; iron studies; ferrittin; possible venofer- dr.B     All questions were answered. The patient knows to call the clinic with any problems, questions or concerns.    Cammie Sickle, MD 04/16/2022 2:06 PM

## 2022-04-16 NOTE — Assessment & Plan Note (Addendum)
#   JULY 2023- Hb 11.7; Ferritin- 4 [PCP; [facility]]; "cold intolerance";s/p IV venofer x3; SEP 2023- Ferritin- 304.  Proceed with Venofer as patient is intolerant to oral iron pills; with ongoing menstrual blood loss.  # ETIOLOGY: suspect menstrual cycles; however recommend GI evaluation [s/p [facility]] re: dysphagia.  Again iscussed the importance of screening colonoscopy given he age.   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  See below  # Graves [on methimazole]; Dr.O'Connell-stable.  # Hypokalemia- 2.9-likely secondary to Lasix.   However patient intolerant to oral potassium.  However currently off Lasix. Discussed regarding dietary options.  Also discussed the importance of taking potassium if she had to go on Lasix.  Discussed options of liquid potassium-defer to PCP.  # DISPOSITION:  # Venofer today # follow up in 6 months- MD; 2-3 days PRIOR labs- cbc/bmp; iron studies; ferrittin; possible venofer- dr.B

## 2022-10-12 ENCOUNTER — Inpatient Hospital Stay: Payer: BC Managed Care – PPO | Attending: Internal Medicine

## 2022-10-12 DIAGNOSIS — Z801 Family history of malignant neoplasm of trachea, bronchus and lung: Secondary | ICD-10-CM | POA: Insufficient documentation

## 2022-10-12 DIAGNOSIS — D649 Anemia, unspecified: Secondary | ICD-10-CM | POA: Insufficient documentation

## 2022-10-12 DIAGNOSIS — E0590 Thyrotoxicosis, unspecified without thyrotoxic crisis or storm: Secondary | ICD-10-CM | POA: Diagnosis not present

## 2022-10-12 DIAGNOSIS — E876 Hypokalemia: Secondary | ICD-10-CM | POA: Insufficient documentation

## 2022-10-12 DIAGNOSIS — E611 Iron deficiency: Secondary | ICD-10-CM

## 2022-10-12 DIAGNOSIS — Z803 Family history of malignant neoplasm of breast: Secondary | ICD-10-CM | POA: Insufficient documentation

## 2022-10-12 LAB — BASIC METABOLIC PANEL
Anion gap: 6 (ref 5–15)
BUN: <5 mg/dL — ABNORMAL LOW (ref 6–20)
CO2: 25 mmol/L (ref 22–32)
Calcium: 9.2 mg/dL (ref 8.9–10.3)
Chloride: 105 mmol/L (ref 98–111)
Creatinine, Ser: 0.83 mg/dL (ref 0.44–1.00)
GFR, Estimated: >60 mL/min (ref >60)
Glucose, Bld: 102 mg/dL — ABNORMAL HIGH (ref 70–99)
Potassium: 3.8 mmol/L (ref 3.5–5.1)
Sodium: 136 mmol/L (ref 135–145)

## 2022-10-12 LAB — IRON AND TIBC
Iron: 107 ug/dL (ref 28–170)
Saturation Ratios: 43 % — ABNORMAL HIGH (ref 10.4–31.8)
TIBC: 248 ug/dL — ABNORMAL LOW (ref 250–450)
UIBC: 141 ug/dL

## 2022-10-12 LAB — CBC WITH DIFFERENTIAL/PLATELET
Abs Immature Granulocytes: 0.01 10*3/uL (ref 0.00–0.07)
Basophils Absolute: 0 10*3/uL (ref 0.0–0.1)
Basophils Relative: 0 %
Eosinophils Absolute: 0.1 10*3/uL (ref 0.0–0.5)
Eosinophils Relative: 1 %
HCT: 33.3 % — ABNORMAL LOW (ref 36.0–46.0)
Hemoglobin: 11.1 g/dL — ABNORMAL LOW (ref 12.0–15.0)
Immature Granulocytes: 0 %
Lymphocytes Relative: 44 %
Lymphs Abs: 2.2 10*3/uL (ref 0.7–4.0)
MCH: 34.2 pg — ABNORMAL HIGH (ref 26.0–34.0)
MCHC: 33.3 g/dL (ref 30.0–36.0)
MCV: 102.5 fL — ABNORMAL HIGH (ref 80.0–100.0)
Monocytes Absolute: 0.4 10*3/uL (ref 0.1–1.0)
Monocytes Relative: 7 %
Neutro Abs: 2.4 10*3/uL (ref 1.7–7.7)
Neutrophils Relative %: 48 %
Platelets: 185 10*3/uL (ref 150–400)
RBC: 3.25 MIL/uL — ABNORMAL LOW (ref 3.87–5.11)
RDW: 15 % (ref 11.5–15.5)
WBC: 5 10*3/uL (ref 4.0–10.5)
nRBC: 0 % (ref 0.0–0.2)

## 2022-10-12 LAB — FERRITIN: Ferritin: 187 ng/mL (ref 11–307)

## 2022-10-12 MED FILL — Iron Sucrose Inj 20 MG/ML (Fe Equiv): INTRAVENOUS | Qty: 10 | Status: AC

## 2022-10-15 ENCOUNTER — Inpatient Hospital Stay: Payer: BC Managed Care – PPO

## 2022-10-15 ENCOUNTER — Inpatient Hospital Stay: Payer: BC Managed Care – PPO | Admitting: Internal Medicine

## 2022-10-15 ENCOUNTER — Encounter: Payer: Self-pay | Admitting: Internal Medicine

## 2022-10-15 VITALS — BP 100/62 | HR 77 | Temp 97.4°F | Resp 17 | Wt 102.0 lb

## 2022-10-15 DIAGNOSIS — E611 Iron deficiency: Secondary | ICD-10-CM

## 2022-10-15 DIAGNOSIS — D649 Anemia, unspecified: Secondary | ICD-10-CM

## 2022-10-15 NOTE — Progress Notes (Signed)
[facility] CONSULT NOTE  Patient Care Team: Mick Sell, MD as PCP - General (Infectious Diseases)  CHIEF COMPLAINTS/PURPOSE OF CONSULTATION: ANEMIA   HEMATOLOGY HISTORY  # ANEMIA [JULY 2023- Hb;11.7;platelets- WBC; Iron sat; ferritin; 4 GFR- WNL; CT/US- ;  EGD/colonoscopy-[none- concerns of anesthesia]  HISTORY OF PRESENTING ILLNESS: Alone.  Ambulating independently.  Ellen Contreras 52 y.o.  female pleasant patient with history of hyperthyroidism on methimazole and history of iron deficiency anemia due to menstrual cycles is here for follow-up.  Patient last received iron infusion approximately 6 months ago.  Patient expresses concerns of chronic nausea for which she follow ups with GI.  Patient has lost significant weight.  Patient on methimazole. Pt had recent dentures.  Chronic diarrhea-noted to have loose stools when after each bowel movement.  Currently awaiting EGD/colonoscopy next week.   Review of Systems  Constitutional:  Positive for chills. Negative for diaphoresis, fever and weight loss.  HENT:  Negative for nosebleeds and sore throat.   Eyes:  Negative for double vision.  Respiratory:  Negative for cough, hemoptysis, sputum production, shortness of breath and wheezing.   Cardiovascular:  Negative for chest pain, palpitations, orthopnea and leg swelling.  Gastrointestinal:  Negative for abdominal pain, blood in stool, constipation, diarrhea, heartburn, melena, nausea and vomiting.  Genitourinary:  Negative for dysuria, frequency and urgency.  Musculoskeletal:  Negative for back pain and joint pain.  Skin: Negative.  Negative for itching and rash.  Neurological:  Negative for dizziness, tingling, focal weakness, weakness and headaches.  Endo/Heme/Allergies:  Does not bruise/bleed easily.  Psychiatric/Behavioral:  Negative for depression. The patient is not nervous/anxious and does not have insomnia.      MEDICAL HISTORY:  Past Medical  History:  Diagnosis Date   Graves disease 2017    SURGICAL HISTORY: History reviewed. No pertinent surgical history.  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 Never smoked; alcohol.    Social Determinants of Health   Financial Resource Strain: Not on file  Food Insecurity: Not on file  Transportation Needs: Not on file  Physical Activity: Not on file  Stress: Not on file  Social Connections: Not on file  Intimate Partner Violence: Not on file    FAMILY HISTORY: Family History  Problem Relation Age of Onset   Healthy Mother    Lung cancer Father        2020   Healthy Sister    Healthy Brother    Breast cancer Cousin     ALLERGIES:  is allergic to furosemide and sulfa antibiotics.  MEDICATIONS:  Current Outpatient Medications  Medication Sig Dispense Refill   methimazole (TAPAZOLE) 5 MG tablet Take 2.5 mg by mouth daily.     Potassium Chloride 40 MEQ/15ML (20%) SOLN Take by mouth.  ethacrynic acid (EDECRIN) 25 MG tablet Take by mouth. (Patient not taking: Reported on 10/15/2022)     meloxicam (MOBIC) 15 MG tablet Take 15 mg by mouth daily. (Patient not taking: Reported on 10/15/2022)     No current facility-administered medications for this visit.    PHYSICAL EXAMINATION:   Vitals:   10/15/22 1446  BP: 100/62  Pulse: 77  Resp: 17  Temp: (!) 97.4 F (36.3 C)  SpO2: 100%    Filed  Weights   10/15/22 1446  Weight: 102 lb (46.3 kg)     Physical Exam Vitals and nursing note reviewed.  HENT:     Head: Normocephalic and atraumatic.     Mouth/Throat:     Pharynx: Oropharynx is clear.  Eyes:     Extraocular Movements: Extraocular movements intact.     Pupils: Pupils are equal, round, and reactive to light.  Cardiovascular:     Rate and Rhythm: Normal rate and regular rhythm.  Pulmonary:     Comments: Decreased breath sounds bilaterally.  Abdominal:     Palpations: Abdomen is soft.  Musculoskeletal:        General: Normal range of motion.     Cervical back: Normal range of motion.  Skin:    General: Skin is warm.  Neurological:     General: No focal deficit present.     Mental Status: She is alert and oriented to person, place, and time.  Psychiatric:        Behavior: Behavior normal.        Judgment: Judgment normal.      LABORATORY DATA:  I have reviewed the data as listed Lab Results  Component Value Date   WBC 5.0 10/12/2022   HGB 11.1 (L) 10/12/2022   HCT 33.3 (L) 10/12/2022   MCV 102.5 (H) 10/12/2022   PLT 185 10/12/2022   Recent Labs    04/16/22 1229 10/12/22 1451  NA 140 136  K 2.9* 3.8  CL 109 105  CO2 25 25  GLUCOSE 89 102*  BUN 5* <5*  CREATININE 0.67 0.83  CALCIUM 8.8* 9.2  GFRNONAA >60 >60     No results found.  ASSESSMENT & PLAN:   Iron deficiency    Symptomatic anemia # JULY 2023- Hb 11.7; Ferritin- 4 [PCP; [facility]]; "cold intolerance"   Status post Venofer; intolerant to oral iron pill. Etiology menstrual blood loss Vs Others.  However April 2024 ferritin greater than 100; saturation-43%.   # Hold IV iron infusion.  Hemoglobin 11.3.  MCV 103-question other causes.  CT scan abdomen pelvis 2022, [place]  negative for any cirrhosis splenomegaly.   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  Check thyroid panel at next visit.  # weight loss-multifactorial chronic nausea/? Dentures; chronic diarrhea [after each meal]-hyperthyroidism.  Await EGD/colonoscopy with GI.  Consider reimaging with CT scan.   # Hypokalemia- 2.9-likely secondary to diuretics/chronic diarrhea. However patient intolerant to oral potassium-potassium within normal limits. Monitor for now.   # DISPOSITION:  # HOLD Venofer today # nutritional referral re: weight loss/ chronic dirrhea # follow up in 3 months- MD; 2-3 days PRIOR labs- cbc/bmp; iron studies; ferrittin;b12 levels;thyroid profile  folic acid-  possible venofer- Dr.B  ll questions were answered.  The patient knows to call the clinic with any problems, questions or concerns.    Earna Coder, MD 10/15/2022 3:37 PM

## 2022-10-15 NOTE — Progress Notes (Signed)
Patient here for oncology follow-up appointment,  concerns of chronic nausea follow ups with GI

## 2022-10-15 NOTE — Assessment & Plan Note (Addendum)
#   JULY 2023- Hb 11.7; Ferritin- 4 [PCP; [facility]]; "cold intolerance"   Status post Venofer; intolerant to oral iron pill. Etiology menstrual blood loss Vs Others.  However April 2024 ferritin greater than 100; saturation-43%.   # Hold IV iron infusion.  Hemoglobin 11.3.  MCV 103-question other causes.  CT scan abdomen pelvis 2022, [facility]  negative for any cirrhosis splenomegaly.   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  Check thyroid panel at next visit.  # weight loss-multifactorial chronic nausea/? Dentures; chronic diarrhea [after each meal]-hyperthyroidism.  Await EGD/colonoscopy with GI.  Consider reimaging with CT scan.   # Hypokalemia- 2.9-likely secondary to diuretics/chronic diarrhea. However patient intolerant to oral potassium-potassium within normal limits. Monitor for now.   # DISPOSITION:  # HOLD Venofer today # nutritional referral re: weight loss/ chronic dirrhea # follow up in 3 months- MD; 2-3 days PRIOR labs- cbc/bmp; iron studies; ferrittin;b12 levels;thyroid profile  folic acid-  possible venofer- Dr.B

## 2022-10-23 ENCOUNTER — Ambulatory Visit (INDEPENDENT_AMBULATORY_CARE_PROVIDER_SITE_OTHER): Payer: BC Managed Care – PPO

## 2022-10-23 ENCOUNTER — Encounter: Payer: Self-pay | Admitting: Internal Medicine

## 2022-10-23 DIAGNOSIS — K2950 Unspecified chronic gastritis without bleeding: Secondary | ICD-10-CM | POA: Diagnosis not present

## 2022-10-23 DIAGNOSIS — K640 First degree hemorrhoids: Secondary | ICD-10-CM | POA: Diagnosis not present

## 2022-10-23 DIAGNOSIS — K52831 Collagenous colitis: Secondary | ICD-10-CM | POA: Diagnosis present

## 2022-10-25 ENCOUNTER — Inpatient Hospital Stay: Payer: BC Managed Care – PPO

## 2022-10-25 NOTE — Progress Notes (Signed)
Nutrition Assessment   Reason for Assessment:   Weight loss, chronic diarrhea   ASSESSMENT:  52 year old female with Fe deficiency anemia related to menstrual cycles.  Patient with Graves disease.  Has been treated with IV venofer unable to tolerate oral iron  Met with patient in clinic.  Patient reports that she received new upper dentures in August 2023 that did not fit well causing difficulty chewing.  Fitting of dentures have improved.  Following with GI for diarrhea.  Had colonoscopy on 4/23 with biopsies taken, results pending.  States that GI thinks she may have IBS.  Also having issues with her thyroid.  Reports that typically eats [employer] chicken sandwich for breakfast and lunch on work days and sometimes has chips.  Supper is whatever she wants to eat because she is close to a bathroom. Likes chicken, fish, green beans, corn, pinto beans.  On the weekend she is more liberal with what he eats due to being near a bathroom.  Drinks tea, koolaid.  Patient is lactose intolerant.      Medications: KCL   Labs: K WNL, glucose 102, Fe 107   Anthropometrics:   Height: 60 inches Weight: 102 lb on 4/15 UBW: 117 lb last August 2023 BMI: 19  13% weight loss in the last 10 months   Estimated Energy Needs  Kcals: 1350-1575 Protein: 68-78 g Fluid: 1350-1575 ml   NUTRITION DIAGNOSIS: Inadequate oral intake related to altered GI function (diarrhea/?IBS) as evidenced by 13% weight loss in the last year and diarrhea   INTERVENTION:  Discussed foods to choose with diarrhea. Diarrhea Medical Nutrition therapy handout provided from AND Discussed lactose free supplement options for patient to try. Sample of [employer] 1.4 provided, other options orgain vegan shake, [employer] shakes.   Samples of enterade provided.  Enterade IBS-D formulation would be encouraged to try to see if improves symptoms.  Can drink 2 a day for first 7 days then drop to 1 a day if symptoms improve.   Would  recommend checking Vit D level.  Will send message to PCP as planning lab work on 5/15 Await results of biopsies and GI follow-up Contact information provided   MONITORING, EVALUATION, GOAL: Weight trends, intake   Next Visit: Friday, May 24 in clinic  Carneshia Raker B. Freida Busman, RD, LDN Registered Dietitian [phone]

## 2022-11-14 ENCOUNTER — Telehealth: Payer: Self-pay

## 2022-11-14 ENCOUNTER — Other Ambulatory Visit: Payer: Self-pay

## 2022-11-14 DIAGNOSIS — Z1231 Encounter for screening mammogram for malignant neoplasm of breast: Secondary | ICD-10-CM

## 2022-11-14 NOTE — Telephone Encounter (Signed)
Nutrition  Message received from patient regarding low vitamin D level that was checked by PCP  Spoke with patient by phone.  Would defer supplementation recommendations to PCP.  Patient reports that PCP has called in prescription for 50,000 IU of Vit D weekly.   Vit B 12 was low as well and will receive injections  RD to follow-up with patient on 5/24  Jalisa Sacco B. Freida Busman, RD, LDN Registered Dietitian [phone]

## 2022-11-23 ENCOUNTER — Inpatient Hospital Stay: Payer: BC Managed Care – PPO | Attending: Internal Medicine

## 2022-11-23 NOTE — Progress Notes (Signed)
Nutrition Follow-up:  Patient with Fe deficiency anemia related to menstrual cycles.  Has been treated with IV venofer as unable to tolerate oral iron.  Noted colonscopy and EGD on 4/23 with collagenous colitis found.  Started on budenoside.    Met with patient in clinic.  Patient reports that she if feeling much better since starting budenoside.  "I am eating better and feeling better."  Diarrhea is down to 2 times a day.  Snacking more during the day (pretzels, sesame sticks, boiled eggs, popcorn).  Was able to eat some pinto beans without issues.  Tried [employer] shakes but prefers [employer].   Medications: reviewed, started Vit B 12 injection, Vit D Unable to take MVI due to rash  Labs: Vit D level checked with PCP and low  Anthropometrics:   Weight 104 lb 8 oz today  102 lb 4/15 117 lb last August 2023  NUTRITION DIAGNOSIS: Inadequate oral intake improved    INTERVENTION:  Since diarrhea has improved can start to liberalize diet slowly.  Add one new food at a time.   Encouraged lean proteins at meal time.   Continue vit B 12 and Vit D supplementation.  Unable to tolerate MVI with iron due to breaking out in rash.      MONITORING, EVALUATION, GOAL: weight trends, intake   NEXT VISIT: Wednesday, July 17th during infusion  Ellen Contreras B. Freida Busman, RD, LDN Registered Dietitian [phone]

## 2022-12-19 ENCOUNTER — Other Ambulatory Visit: Payer: Self-pay | Admitting: Infectious Diseases

## 2022-12-19 DIAGNOSIS — Z1231 Encounter for screening mammogram for malignant neoplasm of breast: Secondary | ICD-10-CM

## 2022-12-21 ENCOUNTER — Ambulatory Visit
Admission: RE | Admit: 2022-12-21 | Discharge: 2022-12-21 | Disposition: A | Discharge status: Home / Self Care | Payer: BC Managed Care – PPO | Source: Ambulatory Visit | Attending: Infectious Diseases | Admitting: Infectious Diseases

## 2022-12-21 DIAGNOSIS — Z1231 Encounter for screening mammogram for malignant neoplasm of breast: Secondary | ICD-10-CM | POA: Diagnosis not present

## 2023-01-14 ENCOUNTER — Inpatient Hospital Stay: Payer: BC Managed Care – PPO | Attending: Internal Medicine

## 2023-01-14 DIAGNOSIS — E0590 Thyrotoxicosis, unspecified without thyrotoxic crisis or storm: Secondary | ICD-10-CM | POA: Diagnosis not present

## 2023-01-14 DIAGNOSIS — R110 Nausea: Secondary | ICD-10-CM | POA: Diagnosis not present

## 2023-01-14 DIAGNOSIS — D649 Anemia, unspecified: Secondary | ICD-10-CM

## 2023-01-14 DIAGNOSIS — Z79899 Other long term (current) drug therapy: Secondary | ICD-10-CM | POA: Diagnosis not present

## 2023-01-14 DIAGNOSIS — E611 Iron deficiency: Secondary | ICD-10-CM

## 2023-01-14 DIAGNOSIS — K529 Noninfective gastroenteritis and colitis, unspecified: Secondary | ICD-10-CM | POA: Insufficient documentation

## 2023-01-14 DIAGNOSIS — Z801 Family history of malignant neoplasm of trachea, bronchus and lung: Secondary | ICD-10-CM | POA: Insufficient documentation

## 2023-01-14 DIAGNOSIS — Z803 Family history of malignant neoplasm of breast: Secondary | ICD-10-CM | POA: Diagnosis not present

## 2023-01-14 DIAGNOSIS — N920 Excessive and frequent menstruation with regular cycle: Secondary | ICD-10-CM | POA: Diagnosis not present

## 2023-01-14 DIAGNOSIS — R634 Abnormal weight loss: Secondary | ICD-10-CM | POA: Diagnosis not present

## 2023-01-14 DIAGNOSIS — D509 Iron deficiency anemia, unspecified: Secondary | ICD-10-CM | POA: Diagnosis present

## 2023-01-14 LAB — CBC WITH DIFFERENTIAL (CANCER CENTER ONLY)
Abs Immature Granulocytes: 0.07 10*3/uL (ref 0.00–0.07)
Basophils Absolute: 0 10*3/uL (ref 0.0–0.1)
Basophils Relative: 0 %
Eosinophils Absolute: 0 10*3/uL (ref 0.0–0.5)
Eosinophils Relative: 0 %
HCT: 36.7 % (ref 36.0–46.0)
Hemoglobin: 12.4 g/dL (ref 12.0–15.0)
Immature Granulocytes: 1 %
Lymphocytes Relative: 15 %
Lymphs Abs: 1.3 10*3/uL (ref 0.7–4.0)
MCH: 34.3 pg — ABNORMAL HIGH (ref 26.0–34.0)
MCHC: 33.8 g/dL (ref 30.0–36.0)
MCV: 101.4 fL — ABNORMAL HIGH (ref 80.0–100.0)
Monocytes Absolute: 0.6 10*3/uL (ref 0.1–1.0)
Monocytes Relative: 7 %
Neutro Abs: 6.7 10*3/uL (ref 1.7–7.7)
Neutrophils Relative %: 77 %
Platelet Count: 200 10*3/uL (ref 150–400)
RBC: 3.62 MIL/uL — ABNORMAL LOW (ref 3.87–5.11)
RDW: 11.7 % (ref 11.5–15.5)
WBC Count: 8.7 10*3/uL (ref 4.0–10.5)
nRBC: 0 % (ref 0.0–0.2)

## 2023-01-14 LAB — VITAMIN B12: Vitamin B-12: 848 pg/mL (ref 180–914)

## 2023-01-14 LAB — BASIC METABOLIC PANEL - CANCER CENTER ONLY
Anion gap: 11 (ref 5–15)
BUN: 12 mg/dL (ref 6–20)
CO2: 25 mmol/L (ref 22–32)
Calcium: 9 mg/dL (ref 8.9–10.3)
Chloride: 101 mmol/L (ref 98–111)
Creatinine: 1.06 mg/dL — ABNORMAL HIGH (ref 0.44–1.00)
GFR, Estimated: >60 mL/min (ref >60)
Glucose, Bld: 105 mg/dL — ABNORMAL HIGH (ref 70–99)
Potassium: 3.7 mmol/L (ref 3.5–5.1)
Sodium: 137 mmol/L (ref 135–145)

## 2023-01-14 LAB — FERRITIN: Ferritin: 59 ng/mL (ref 11–307)

## 2023-01-14 LAB — IRON AND TIBC
Iron: 65 ug/dL (ref 28–170)
Saturation Ratios: 18 % (ref 10.4–31.8)
TIBC: 364 ug/dL (ref 250–450)
UIBC: 299 ug/dL

## 2023-01-14 LAB — FOLATE: Folate: 4.3 ng/mL — ABNORMAL LOW (ref >5.9)

## 2023-01-15 LAB — THYROID PANEL WITH TSH
Free Thyroxine Index: 1.5 (ref 1.2–4.9)
T3 Uptake Ratio: 23 % — ABNORMAL LOW (ref 24–39)
T4, Total: 6.4 ug/dL (ref 4.5–12.0)
TSH: 3 u[IU]/mL (ref 0.450–4.500)

## 2023-01-15 MED FILL — Iron Sucrose Inj 20 MG/ML (Fe Equiv): INTRAVENOUS | Qty: 10 | Status: AC

## 2023-01-16 ENCOUNTER — Inpatient Hospital Stay: Payer: BC Managed Care – PPO

## 2023-01-16 ENCOUNTER — Encounter: Payer: Self-pay | Admitting: Internal Medicine

## 2023-01-16 ENCOUNTER — Inpatient Hospital Stay: Payer: BC Managed Care – PPO | Admitting: Internal Medicine

## 2023-01-16 VITALS — BP 145/72 | HR 108 | Temp 97.9°F | Ht 60.0 in | Wt 117.0 lb

## 2023-01-16 DIAGNOSIS — D509 Iron deficiency anemia, unspecified: Secondary | ICD-10-CM | POA: Diagnosis not present

## 2023-01-16 DIAGNOSIS — D649 Anemia, unspecified: Secondary | ICD-10-CM | POA: Diagnosis not present

## 2023-01-16 NOTE — Progress Notes (Signed)
Nutrition Follow-up:  Patient with iron deficiency anemia related to menstrual cycle.  Patient with collagenous colitis found.  Started on budenoside.  Starting to wean this medication down.    Met with patient following MD.  Patient reports good appetite.  Eating 3 meals a day plus snacks.  Has increased volume and variety of foods in diet.  Has not really tried many vegetables in diet.  Tried some frozen custard without difficulty recently.    Medications: reviewed  Labs: folate 4.3 Vit B 12 WNL Fe WNL  Anthropometrics:   Weight 117 lb today (fluid present) 104 lb 8 oz on 5/24 102 lb 4/15 117 lb last August 2023   NUTRITION DIAGNOSIS: Inadequate oral intake improved   INTERVENTION:  Continue to increase variety of foods in diet.  Continue good sources of protein MD wanting patient to restart fluid pill Monitor weight at least weekly MD adding folate today Continue Vit B 12, Vit D supplementation Patient has contact information and will contact RD if needed in the future     NEXT VISIT: no follow-up RD available if needed  Engelbert Sevin B. Freida Busman, RD, LDN Registered Dietitian [phone]

## 2023-01-16 NOTE — Progress Notes (Signed)
C/o cramps in fingers and toes.  C/o swelling lower legs and ankles.  Fatigue/weakness: no Dyspena: no Light headedness: no Blood in stool: no

## 2023-01-16 NOTE — Assessment & Plan Note (Addendum)
#   JULY 2023- Hb 11.7; Ferritin- 4 [PCP; [facility]]; "cold intolerance"   Status post Venofer; intolerant to oral iron pill.     JULY 2024-saturation-18%. Ferritin- 59. Folic acid-low 4.3.   # Hold IV iron infusion.  Hemoglobin 12.2 .  MCV 103-question other causes.    # ETIOLOGY: suspect menstrual cycles; EGD- wnl; Colitis- on Budenoside Etiology menstrual blood loss Vs Others. CT scan abdomen pelvis 2022, [facility]  negative for any cirrhosis splenomegaly.  # Graves [on methimazole]; Dr.O'Connell- JULY 2024 thyroid panel- WNL; defer to PCP.   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   # Hypokalemia- to diuretics/chronic diarrhea- stable.    # weight loss-multifactorial chronic nausea/? Dentures; chronic diarrhea - stable; awaiting nutrition evaluation today.   # DISPOSITION:  # HOLD Venofer today # follow up in 6  months- MD; 2-3 days PRIOR labs- cbc/bmp; iron studies; ferrittin;b12 levels;thyroid profile;  folic acid-  possible venofer- Dr.B

## 2023-01-16 NOTE — Progress Notes (Signed)
[facility] CONSULT NOTE  Patient Care Team: Mick Sell, MD as PCP - General (Infectious Diseases)  CHIEF COMPLAINTS/PURPOSE OF CONSULTATION: ANEMIA   HEMATOLOGY HISTORY  # ANEMIA [JULY 2023- Hb;11.7;platelets- WBC; Iron sat; ferritin; 4 GFR- WNL; CT/US- ;  EGD/colonoscopy-[none- concerns of anesthesia]  HISTORY OF PRESENTING ILLNESS: Alone.  Ambulating independently.  Ellen Contreras 52 y.o.  female pleasant patient with history of hyperthyroidism on methimazole and history of iron deficiency anemia due to menstrual cycles is here for follow-up.  Patient last received iron infusion approximately 6 months ago.  Patient expresses concerns of chronic nausea for which she follow ups with GI.  Patient has lost significant weight.  Patient on methimazole. Pt had recent dentures.  Chronic diarrhea-noted to have loose stools when after each bowel movement.  Currently awaiting EGD/colonoscopy next week.   Review of Systems  Constitutional:  Positive for chills. Negative for diaphoresis, fever and weight loss.  HENT:  Negative for nosebleeds and sore throat.   Eyes:  Negative for double vision.  Respiratory:  Negative for cough, hemoptysis, sputum production, shortness of breath and wheezing.   Cardiovascular:  Negative for chest pain, palpitations, orthopnea and leg swelling.  Gastrointestinal:  Negative for abdominal pain, blood in stool, constipation, diarrhea, heartburn, melena, nausea and vomiting.  Genitourinary:  Negative for dysuria, frequency and urgency.  Musculoskeletal:  Negative for back pain and joint pain.  Skin: Negative.  Negative for itching and rash.  Neurological:  Negative for dizziness, tingling, focal weakness, weakness and headaches.  Endo/Heme/Allergies:  Does not bruise/bleed easily.  Psychiatric/Behavioral:  Negative for depression. The patient is not nervous/anxious and does not have insomnia.      MEDICAL HISTORY:  Past Medical  History:  Diagnosis Date   Graves disease 2017    SURGICAL HISTORY: History reviewed. No pertinent surgical history.  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 Never smoked; alcohol.    Social Determinants of Health   Financial Resource Strain: Not on file  Food Insecurity: Not on file  Transportation Needs: Not on file  Physical Activity: Not on file  Stress: Not on file  Social Connections: Not on file  Intimate Partner Violence: Not on file    FAMILY HISTORY: Family History  Problem Relation Age of Onset   Healthy Mother    Lung cancer Father        2020   Healthy Sister    Healthy Brother    Breast cancer Cousin     ALLERGIES:  is allergic to furosemide and sulfa antibiotics.  MEDICATIONS:  Current Outpatient Medications  Medication Sig Dispense Refill   budesonide (ENTOCORT EC) 3 MG 24 hr capsule Take 3 capsules by mouth every morning.     methimazole (TAPAZOLE) 5 MG tablet Take 2.5 mg  by mouth daily.     Potassium Chloride 40 MEQ/15ML (20%) SOLN Take by mouth.     ethacrynic acid (EDECRIN) 25 MG tablet Take by mouth. (Patient not taking: Reported on 10/15/2022)     meloxicam (MOBIC) 15 MG tablet Take 15 mg by mouth daily. (Patient not taking: Reported on 10/15/2022)     No current facility-administered medications for this visit.    PHYSICAL EXAMINATION:   Vitals:   01/16/23 1338   BP: (!) 145/72  Pulse: (!) 108  Temp: 97.9 F (36.6 C)  SpO2: 100%     Filed Weights   01/16/23 1338  Weight: 117 lb (53.1 kg)      Physical Exam Vitals and nursing note reviewed.  HENT:     Head: Normocephalic and atraumatic.     Mouth/Throat:     Pharynx: Oropharynx is clear.  Eyes:     Extraocular Movements: Extraocular movements intact.     Pupils: Pupils are equal, round, and reactive to light.  Cardiovascular:     Rate and Rhythm: Normal rate and regular rhythm.  Pulmonary:     Comments: Decreased breath sounds bilaterally.  Abdominal:     Palpations: Abdomen is soft.  Musculoskeletal:        General: Normal range of motion.     Cervical back: Normal range of motion.  Skin:    General: Skin is warm.  Neurological:     General: No focal deficit present.     Mental Status: She is alert and oriented to person, place, and time.  Psychiatric:        Behavior: Behavior normal.        Judgment: Judgment normal.      LABORATORY DATA:  I have reviewed the data as listed Lab Results  Component Value Date   WBC 8.7 01/14/2023   HGB 12.4 01/14/2023   HCT 36.7 01/14/2023   MCV 101.4 (H) 01/14/2023   PLT 200 01/14/2023   Recent Labs    04/16/22 1229 10/12/22 1451 01/14/23 1440  NA 140 136 137  K 2.9* 3.8 3.7  CL 109 105 101  CO2 25 25 25   GLUCOSE 89 102* 105*  BUN 5* <5* 12  CREATININE 0.67 0.83 1.06*  CALCIUM 8.8* 9.2 9.0  GFRNONAA >60 >60 >60     MM 3D SCREENING MAMMOGRAM BILATERAL BREAST  Result Date: 12/25/2022 CLINICAL DATA:  Screening. EXAM: DIGITAL SCREENING BILATERAL MAMMOGRAM WITH TOMOSYNTHESIS AND CAD TECHNIQUE: Bilateral screening digital craniocaudal and mediolateral oblique mammograms were obtained. Bilateral screening digital breast tomosynthesis was performed. The images were evaluated with computer-aided detection. COMPARISON:  Previous exam(s). ACR Breast Density Category d: The breasts are extremely dense, which lowers the  sensitivity of mammography. FINDINGS: There are no findings suspicious for malignancy. IMPRESSION: No mammographic evidence of malignancy. A result letter of this screening mammogram will be mailed directly to the patient. RECOMMENDATION: Screening mammogram in one year. (Code:SM-B-01Y) BI-RADS CATEGORY  1: Negative. Electronically Signed   By: Edwin Cap M.D.   On: 12/25/2022 10:56    ASSESSMENT & PLAN:   Symptomatic anemia # JULY 2023- Hb 11.7; Ferritin- 4 [PCP; [facility]]; "cold intolerance"   Status post Venofer; intolerant to oral iron pill.     JULY 2024-saturation-18%. Ferritin- 59. Folic acid-low 4.3.   # Hold IV iron infusion.  Hemoglobin 12.2 .  MCV 103-question other causes.    # ETIOLOGY: suspect menstrual cycles; EGD- wnl; Colitis- on Budenoside Etiology menstrual blood loss Vs Others. CT scan  abdomen pelvis 2022, [place]  negative for any cirrhosis splenomegaly.  # Graves [on methimazole]; Dr.O'Connell- JULY 2024 thyroid panel- WNL; defer to PCP.   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   # Hypokalemia- to diuretics/chronic diarrhea- stable.    # weight loss-multifactorial chronic nausea/? Dentures; chronic diarrhea - stable; awaiting nutrition evaluation today.   # DISPOSITION:  # HOLD Venofer today # follow up in 6  months- MD; 2-3 days PRIOR labs- cbc/bmp; iron studies; ferrittin;b12 levels;thyroid profile;  folic acid-  possible venofer- Dr.B  ll questions were answered. The patient knows to call the clinic with any problems, questions or concerns.    Earna Coder, MD 01/16/2023 2:25 PM

## 2023-07-16 ENCOUNTER — Inpatient Hospital Stay: Payer: BC Managed Care – PPO

## 2023-07-19 ENCOUNTER — Ambulatory Visit: Payer: BC Managed Care – PPO | Admitting: Internal Medicine

## 2023-07-19 ENCOUNTER — Ambulatory Visit: Payer: BC Managed Care – PPO

## 2023-07-26 ENCOUNTER — Other Ambulatory Visit: Payer: BC Managed Care – PPO

## 2023-07-30 ENCOUNTER — Ambulatory Visit: Payer: BC Managed Care – PPO

## 2023-07-30 ENCOUNTER — Ambulatory Visit: Payer: BC Managed Care – PPO | Admitting: Internal Medicine

## 2023-08-14 ENCOUNTER — Inpatient Hospital Stay: Payer: BC Managed Care – PPO | Attending: Internal Medicine

## 2023-08-14 ENCOUNTER — Other Ambulatory Visit: Payer: Self-pay | Admitting: *Deleted

## 2023-08-14 DIAGNOSIS — E876 Hypokalemia: Secondary | ICD-10-CM | POA: Insufficient documentation

## 2023-08-14 DIAGNOSIS — M7989 Other specified soft tissue disorders: Secondary | ICD-10-CM | POA: Insufficient documentation

## 2023-08-14 DIAGNOSIS — D649 Anemia, unspecified: Secondary | ICD-10-CM | POA: Insufficient documentation

## 2023-08-14 DIAGNOSIS — E0500 Thyrotoxicosis with diffuse goiter without thyrotoxic crisis or storm: Secondary | ICD-10-CM | POA: Insufficient documentation

## 2023-08-14 DIAGNOSIS — E538 Deficiency of other specified B group vitamins: Secondary | ICD-10-CM | POA: Insufficient documentation

## 2023-08-14 DIAGNOSIS — Z79899 Other long term (current) drug therapy: Secondary | ICD-10-CM | POA: Diagnosis not present

## 2023-08-14 LAB — CBC WITH DIFFERENTIAL (CANCER CENTER ONLY)
Abs Immature Granulocytes: 0.01 10*3/uL (ref 0.00–0.07)
Basophils Absolute: 0 10*3/uL (ref 0.0–0.1)
Basophils Relative: 0 %
Eosinophils Absolute: 0.1 10*3/uL (ref 0.0–0.5)
Eosinophils Relative: 2 %
HCT: 36.6 % (ref 36.0–46.0)
Hemoglobin: 12.2 g/dL (ref 12.0–15.0)
Immature Granulocytes: 0 %
Lymphocytes Relative: 34 %
Lymphs Abs: 2.1 10*3/uL (ref 0.7–4.0)
MCH: 29.5 pg (ref 26.0–34.0)
MCHC: 33.3 g/dL (ref 30.0–36.0)
MCV: 88.4 fL (ref 80.0–100.0)
Monocytes Absolute: 0.6 10*3/uL (ref 0.1–1.0)
Monocytes Relative: 9 %
Neutro Abs: 3.4 10*3/uL (ref 1.7–7.7)
Neutrophils Relative %: 55 %
Platelet Count: 205 10*3/uL (ref 150–400)
RBC: 4.14 MIL/uL (ref 3.87–5.11)
RDW: 12.3 % (ref 11.5–15.5)
WBC Count: 6.2 10*3/uL (ref 4.0–10.5)
nRBC: 0 % (ref 0.0–0.2)

## 2023-08-14 LAB — BASIC METABOLIC PANEL
Anion gap: 12 (ref 5–15)
BUN: <5 mg/dL — ABNORMAL LOW (ref 6–20)
CO2: 25 mmol/L (ref 22–32)
Calcium: 9.5 mg/dL (ref 8.9–10.3)
Chloride: 100 mmol/L (ref 98–111)
Creatinine, Ser: 0.68 mg/dL (ref 0.44–1.00)
GFR, Estimated: >60 mL/min (ref >60)
Glucose, Bld: 93 mg/dL (ref 70–99)
Potassium: 2.5 mmol/L — CL (ref 3.5–5.1)
Sodium: 137 mmol/L (ref 135–145)

## 2023-08-14 LAB — IRON AND TIBC
Iron: 85 ug/dL (ref 28–170)
Saturation Ratios: 27 % (ref 10.4–31.8)
TIBC: 314 ug/dL (ref 250–450)
UIBC: 229 ug/dL

## 2023-08-14 LAB — VITAMIN B12: Vitamin B-12: 1972 pg/mL — ABNORMAL HIGH (ref 180–914)

## 2023-08-14 LAB — FERRITIN: Ferritin: 184 ng/mL (ref 11–307)

## 2023-08-14 LAB — FOLATE: Folate: 7.5 ng/mL (ref >5.9)

## 2023-08-14 MED ORDER — POTASSIUM CHLORIDE CRYS ER 20 MEQ PO TBCR: 20.0000 meq | EXTENDED_RELEASE_TABLET | Freq: Two times a day (BID) | ORAL | 0 refills | Status: DC

## 2023-08-16 LAB — THYROID PANEL WITH TSH
Free Thyroxine Index: 2.2 (ref 1.2–4.9)
T3 Uptake Ratio: 27 % (ref 24–39)
T4, Total: 8.2 ug/dL (ref 4.5–12.0)
TSH: 0.077 u[IU]/mL — ABNORMAL LOW (ref 0.450–4.500)

## 2023-08-19 ENCOUNTER — Encounter: Payer: Self-pay | Admitting: Internal Medicine

## 2023-08-19 ENCOUNTER — Inpatient Hospital Stay: Payer: BC Managed Care – PPO | Admitting: Internal Medicine

## 2023-08-19 ENCOUNTER — Inpatient Hospital Stay: Payer: BC Managed Care – PPO

## 2023-08-19 VITALS — BP 120/76 | HR 72 | Temp 96.8°F | Resp 14 | Wt 117.0 lb

## 2023-08-19 DIAGNOSIS — D649 Anemia, unspecified: Secondary | ICD-10-CM | POA: Diagnosis not present

## 2023-08-19 NOTE — Assessment & Plan Note (Addendum)
#   JULY 2023- Hb 11.7; Ferritin- 4 [PCP; [facility]]; "cold intolerance"   Status post Venofer; intolerant to oral iron pill.  FEB 2025- -saturation-27%. Ferritin- 84. Hold IV iron infusion.  Hemoglobin 12.2 . Stable.  Last iron infusion in September 2023.  Currently iron levels are stable.  Recommend follow-up with PCP for iron studies.   # ETIOLOGY: suspect menstrual cycles; EGD- wnl; Colitis- on Budenoside Etiology menstrual blood loss Vs Others. CT scan abdomen pelvis 2022, [place]  negative for any cirrhosis splenomegaly.  # Graves [on methimazole]; Dr.O'Connell- FEB 2025- TSH- 003; N-T4-endocrinology.  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  # Severe Hypokalemia- FEB 2025- 2.5- ? Etiology Currently OFF diuretics/chronic diarrhea- defer to PCP re: furtherw ork up including nephrology evaluation,. Continue 40 KCL meq liquid.  Twice a day for the next 1 week.  And recommend follow-up with PCP for further recommendations/labs.  # Elevated B12- 1920- recommend HOLD off B12 supplement; continue daily MVT.   # Left ear- Fullness- fluid noted- no signs of infection- recommend claritin;sudafed- if mot improved follow up wt PCP/  #Since patient is clinically stable I think is reasonable for the patient to follow-up with PCP/can follow-up with [place] as needed.  Patient comfortable with the plan; to call us if any questions or concerns in the interim.   # DISPOSITION: # hold venofer # follow up as needed- Dr.B

## 2023-08-19 NOTE — Progress Notes (Signed)
[facility] CONSULT NOTE  Patient Care Team: Ellen Sell, MD as PCP - General (Infectious Diseases) Ellen Coder, MD as Consulting Physician (Oncology)  CHIEF COMPLAINTS/PURPOSE OF CONSULTATION: ANEMIA   HEMATOLOGY HISTORY  # ANEMIA [JULY 2023- Hb;11.7;platelets- WBC; Iron sat; ferritin; 4 GFR- WNL; CT/US- ;  EGD/colonoscopy-[none- concerns of anesthesia]  HISTORY OF PRESENTING ILLNESS: Alone.  Ambulating independently.  Ellen Contreras 53 y.o.  female pleasant patient with history of hyperthyroidism on methimazole and history of iron deficiency anemia due to menstrual cycles is here for follow-up.  Of note patient was noted to have severe hypokalemia 2.5 potassium at last visit.  Patient started on K-Dur.  Patient last received iron infusion > 12 months ago.  Patient complains of extreme fatigue.  Also complains of left ear fullness.  Patient has recent cold currently improved.    Review of Systems  Constitutional:  Positive for chills and malaise/fatigue. Negative for diaphoresis, fever and weight loss.  HENT:  Positive for ear pain. Negative for nosebleeds and sore throat.   Eyes:  Negative for double vision.  Respiratory:  Negative for cough, hemoptysis, sputum production, shortness of breath and wheezing.   Cardiovascular:  Negative for chest pain, palpitations, orthopnea and leg swelling.  Gastrointestinal:  Negative for abdominal pain, blood in stool, constipation, diarrhea, heartburn, melena, nausea and vomiting.  Genitourinary:  Negative for dysuria, frequency and urgency.  Musculoskeletal:  Negative for back pain and joint pain.  Skin: Negative.  Negative for itching and rash.  Neurological:  Negative for dizziness, tingling, focal weakness, weakness and headaches.  Endo/Heme/Allergies:  Does not bruise/bleed easily.  Psychiatric/Behavioral:  Negative for depression. The patient is not nervous/anxious and does not have insomnia.       MEDICAL HISTORY:  Past Medical History:  Diagnosis Date   Graves disease 2017    SURGICAL HISTORY: History reviewed. No pertinent surgical history.  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 Never smoked; alcohol.    Social Drivers of Health   Financial Resource Strain: Patient Declined (05/15/2023)   Received from [facility]   Overall Financial Resource Strain (CARDIA)    Difficulty of Paying Living Expenses: Patient declined  Food Insecurity: Patient Declined (05/15/2023)   Received from [facility]   Hunger Vital Sign    Worried About Running Out of Food in the Last Year: Patient declined    Ran Out of Food in the Last Year: Patient declined  Transportation Needs: No Transportation Needs (05/15/2023)   Received from [facility] - Transportation    In the past 12 months, has lack of transportation kept you from medical appointments or from getting medications?: No    Lack of Transportation (Non-Medical): No  Physical Activity: Not  on file  Stress: Not on file  Social Connections: Not on file  Intimate Partner Violence: Not on file    FAMILY HISTORY: Family History  Problem Relation Age of Onset   Healthy Mother    Lung cancer Father        2020   Healthy Sister    Healthy Brother    Breast cancer Cousin     ALLERGIES:   is allergic to furosemide and sulfa antibiotics.  MEDICATIONS:  Current Outpatient Medications  Medication Sig Dispense Refill   folic acid (FOLVITE) 800 MCG tablet Take 400 mcg by mouth daily.     methimazole (TAPAZOLE) 5 MG tablet Take 2.5 mg by mouth daily.     Potassium Chloride 40 MEQ/15ML (20%) SOLN Take by mouth. 7.5 ml daily     Vitamin D, Ergocalciferol, (DRISDOL) 1.25 MG (50000 UNIT) CAPS capsule Take 50,000 Units by mouth once a week.     ethacrynic acid (EDECRIN) 25 MG tablet Take by mouth. (Patient not taking: Reported on 08/19/2023)     No current facility-administered medications for this visit.    PHYSICAL EXAMINATION:   Vitals:   08/19/23 1448  BP: 120/76  Pulse: 72  Resp: 14  Temp: (!) 96.8 F (36 C)  SpO2: 100%      Filed Weights   08/19/23 1448  Weight: 117 lb (53.1 kg)       Physical Exam Vitals and nursing note reviewed.  HENT:     Head: Normocephalic and atraumatic.     Mouth/Throat:     Pharynx: Oropharynx is clear.  Eyes:     Extraocular Movements: Extraocular movements intact.     Pupils: Pupils are equal, round, and reactive to light.  Cardiovascular:     Rate and Rhythm: Normal rate and regular rhythm.  Pulmonary:     Comments: Decreased breath sounds bilaterally.  Abdominal:     Palpations: Abdomen is soft.  Musculoskeletal:        General: Normal range of motion.     Cervical back: Normal range of motion.  Skin:    General: Skin is warm.  Neurological:     General: No focal deficit present.     Mental Status: She is alert and oriented to person, place, and time.  Psychiatric:        Behavior: Behavior normal.        Judgment: Judgment normal.      LABORATORY DATA:  I have reviewed the data as listed Lab Results  Component Value Date   WBC 6.2 08/14/2023   HGB 12.2 08/14/2023   HCT 36.6 08/14/2023   MCV 88.4 08/14/2023   PLT 205 08/14/2023   Recent Labs    10/12/22 1451 01/14/23 1440 08/14/23 1526  NA 136 137  137  K 3.8 3.7 2.5*  CL 105 101 100  CO2 25 25 25   GLUCOSE 102* 105* 93  BUN <5* 12 <5*  CREATININE 0.83 1.06* 0.68  CALCIUM 9.2 9.0 9.5  GFRNONAA >60 >60 >60     No results found.  ASSESSMENT & PLAN:   Symptomatic anemia # JULY 2023- Hb 11.7; Ferritin- 4 [PCP; [facility]]; "cold intolerance"   Status post Venofer; intolerant to oral iron pill.  FEB 2025- -saturation-27%. Ferritin- 84. Hold IV iron infusion.  Hemoglobin 12.2 . Stable.  Last iron infusion in September 2023.  Currently iron levels are stable.  Recommend follow-up with PCP for iron studies.   # ETIOLOGY: suspect menstrual cycles; EGD- wnl; Colitis-  on Budenoside Etiology menstrual blood loss Vs Others. CT scan abdomen pelvis 2022, MAY  negative for any cirrhosis splenomegaly.  # Graves [on methimazole]; Dr.O'Connell- FEB 2025- TSH- 003; N-T4-endocrinology.  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  # Severe Hypokalemia- FEB 2025- 2.5- ? Etiology Currently OFF diuretics/chronic diarrhea- defer to PCP re: furtherw ork up including nephrology evaluation,. Continue 40 KCL meq liquid.  Twice a day for the next 1 week.  And recommend follow-up with PCP for further recommendations/labs.  # Elevated B12- 1920- recommend HOLD off B12 supplement; continue daily MVT.   # Left ear- Fullness- fluid noted- no signs of infection- recommend claritin;sudafed- if mot improved follow up wt PCP/  #Since patient is clinically stable I think is reasonable for the patient to follow-up with PCP/can follow-up with [place] as needed.  Patient comfortable with the plan; to call us if any questions or concerns in the interim.   # DISPOSITION: # hold venofer # follow up as needed- Dr.B  ll questions were answered. The patient knows to call the clinic with any problems, questions or concerns.    Ellen Coder, MD 08/19/2023 3:21 PM

## 2023-08-19 NOTE — Progress Notes (Signed)
pt weight is same as last visit but she states she had gained some and has lost again. Reports decreased appetite as well.  Pt reports she has consulted with nutritionist, Joli before.

## 2023-10-22 ENCOUNTER — Other Ambulatory Visit: Payer: Self-pay | Admitting: Family Medicine

## 2023-10-22 DIAGNOSIS — M5416 Radiculopathy, lumbar region: Secondary | ICD-10-CM

## 2023-10-23 ENCOUNTER — Encounter: Payer: Self-pay | Admitting: Internal Medicine

## 2023-10-24 ENCOUNTER — Other Ambulatory Visit: Payer: Self-pay | Admitting: Otolaryngology

## 2023-10-26 ENCOUNTER — Ambulatory Visit
Admission: RE | Admit: 2023-10-26 | Discharge: 2023-10-26 | Disposition: A | Discharge status: Home / Self Care | Source: Ambulatory Visit | Attending: Family Medicine | Admitting: Family Medicine

## 2023-10-26 DIAGNOSIS — M5416 Radiculopathy, lumbar region: Secondary | ICD-10-CM

## 2023-11-14 ENCOUNTER — Other Ambulatory Visit: Payer: Self-pay | Admitting: Infectious Diseases

## 2023-11-14 ENCOUNTER — Other Ambulatory Visit: Payer: Self-pay | Admitting: Family Medicine

## 2023-11-14 DIAGNOSIS — Z1231 Encounter for screening mammogram for malignant neoplasm of breast: Secondary | ICD-10-CM

## 2023-11-14 DIAGNOSIS — M5412 Radiculopathy, cervical region: Secondary | ICD-10-CM

## 2023-11-28 ENCOUNTER — Ambulatory Visit: Admit: 2023-11-28 | Admitting: Otolaryngology

## 2023-11-28 SURGERY — MYRINGOTOMY WITH TUBE PLACEMENT
Anesthesia: General | Laterality: Left

## 2023-12-04 ENCOUNTER — Other Ambulatory Visit: Payer: Self-pay | Admitting: Family Medicine

## 2023-12-04 DIAGNOSIS — M5412 Radiculopathy, cervical region: Secondary | ICD-10-CM

## 2024-04-27 ENCOUNTER — Encounter: Payer: Self-pay | Admitting: Internal Medicine

## 2024-04-28 ENCOUNTER — Ambulatory Visit (INDEPENDENT_AMBULATORY_CARE_PROVIDER_SITE_OTHER)

## 2024-04-28 ENCOUNTER — Ambulatory Visit: Admitting: Podiatry

## 2024-04-28 ENCOUNTER — Encounter: Payer: Self-pay | Admitting: Podiatry

## 2024-04-28 VITALS — Ht 60.0 in | Wt 117.0 lb

## 2024-04-28 DIAGNOSIS — B353 Tinea pedis: Secondary | ICD-10-CM | POA: Diagnosis not present

## 2024-04-28 DIAGNOSIS — B351 Tinea unguium: Secondary | ICD-10-CM | POA: Diagnosis not present

## 2024-04-28 DIAGNOSIS — M722 Plantar fascial fibromatosis: Secondary | ICD-10-CM | POA: Diagnosis not present

## 2024-04-28 DIAGNOSIS — M7752 Other enthesopathy of left foot: Secondary | ICD-10-CM | POA: Diagnosis not present

## 2024-04-28 MED ORDER — CLOTRIMAZOLE-BETAMETHASONE 1-0.05 % EX CREA: 1.0000 | TOPICAL_CREAM | Freq: Every day | CUTANEOUS | 2 refills | Status: DC

## 2024-04-28 MED ORDER — TERBINAFINE HCL 250 MG PO TABS: 250.0000 mg | ORAL_TABLET | Freq: Every day | ORAL | 0 refills | Status: AC

## 2024-04-28 MED ORDER — BETAMETHASONE SOD PHOS & ACET 6 (3-3) MG/ML IJ SUSP
3.0000 mg | Freq: Once | INTRAMUSCULAR | Status: AC
  Administered 2024-04-28: 3 mg via INTRA_ARTICULAR

## 2024-04-28 NOTE — Progress Notes (Signed)
   Chief Complaint  Patient presents with   Foot Pain    Pt is here due to left foot pain, she states that she has been having issues with the left foot since being a teenager, states the foot always hurts, can't stand directly on the foot without pain, pain is mostly in the heel and ball of the foot, states she has been soaking the foot with no relief.    HPI: 53 y.o. female presenting today as a new patient for evaluation of left foot pain.  Chronic ongoing for several years.  Past Medical History:  Diagnosis Date   Graves disease 2017    No past surgical history on file.  Allergies  Allergen Reactions   Furosemide Rash   Sulfa Antibiotics Hives     Physical Exam: General: The patient is alert and oriented x3 in no acute distress.  Dermatology: Skin is warm, dry and supple bilateral lower extremities.  Diffuse hyperkeratotic peeling callus tissue noted especially to the weightbearing surface of the feet with associated tenderness  Vascular: Palpable pedal pulses bilaterally. Capillary refill within normal limits.  No appreciable edema.  No erythema.  Neurological: Grossly intact via light touch  Musculoskeletal Exam: Tenderness to palpation noted to the plantar fascia left  Radiographic Exam LT foot 04/28/2024:  Normal osseous mineralization. Joint spaces preserved.  No fractures or osseous irregularities noted.  Impression: Negative  Assessment/Plan of Care: 1.  Plantar fasciitis left 2.  Metatarsalgia left 3.  Onychomycosis of toenails left 1-5 4.  Tinea pedis left  -Patient evaluated.  X-rays reviewed -Injection of 0.5 cc Celestone Soluspan injected into the plantar fascia left -No NSAIDs or anti-inflammatory medication was prescribed.  She is currently already on a steroid medication and has concerns with NSAIDs interacting with her thyroid  disorders -Advised against going barefoot.  Recommend good supportive tennis shoes and sneakers -Recommend recovery slides at  home.  Also recommended [employer] refrain from going barefoot -Today we discussed the pathology and etiology of toenail fungus.  Different treatment modalities and efficacies including oral, topical, and laser antifungal treatment modalities were discussed.  Risks and benefits associated with each modality were explained.  She would like to pursue oral antifungal medication which I believe is the most effective -Prescription for Lamisil 250 mg #90 daily.  No history of liver pathology or symptoms -Prescription for Lotrisone cream apply twice daily to the feet -Return to clinic 4 weeks       Thresa EMERSON Sar, DPM [facility]  Dr. Thresa EMERSON Sar, DPM    [address] [place], [place] [address]                Office [phone]  Fax [phone]

## 2024-05-04 ENCOUNTER — Telehealth: Payer: Self-pay | Admitting: Podiatry

## 2024-05-04 NOTE — Telephone Encounter (Signed)
 Patient experienced allergic reaction from Terbinafine, would like to try something topical, Also patient stated she did not get any release from cortisone shot in heel for plantar fasciitis

## 2024-05-06 ENCOUNTER — Other Ambulatory Visit: Payer: Self-pay | Admitting: Podiatry

## 2024-05-06 MED ORDER — CICLOPIROX 8 % EX SOLN: Freq: Every day | CUTANEOUS | 2 refills | Status: AC

## 2024-05-06 NOTE — Progress Notes (Signed)
 PRN toenail fungus. Allergic rxn to oral Lamisil. Discontinue.   Thresa EMERSON Sar, DPM [facility]  Dr. Thresa EMERSON Sar, DPM    [address] [place], [place] [address]                Office [phone]  Fax [phone]

## 2024-05-26 ENCOUNTER — Encounter: Payer: Self-pay | Admitting: Podiatry

## 2024-05-26 ENCOUNTER — Ambulatory Visit: Admitting: Podiatry

## 2024-05-26 VITALS — Ht 60.0 in | Wt 117.0 lb

## 2024-05-26 DIAGNOSIS — M722 Plantar fascial fibromatosis: Secondary | ICD-10-CM | POA: Diagnosis not present

## 2024-05-26 MED ORDER — CLOTRIMAZOLE-BETAMETHASONE 1-0.05 % EX CREA: TOPICAL_CREAM | Freq: Two times a day (BID) | CUTANEOUS | 2 refills | Status: AC

## 2024-06-07 NOTE — Progress Notes (Signed)
     Chief Complaint  Patient presents with   Nail Problem    Pt is here to f/u on toenail fungus, she states everything is going well.    HPI: 53 y.o. female presenting today as a new patient for evaluation of left foot pain.  Chronic ongoing for several years.  Past Medical History:  Diagnosis Date   Graves disease 2017    No past surgical history on file.  Allergies  Allergen Reactions   Furosemide Rash   Sulfa Antibiotics Hives     Physical Exam: General: The patient is alert and oriented x3 in no acute distress.  Dermatology: Skin is warm, dry and supple bilateral lower extremities.  Diffuse hyperkeratotic peeling callus tissue noted especially to the weightbearing surface of the feet with associated tenderness  Vascular: Palpable pedal pulses bilaterally. Capillary refill within normal limits.  No appreciable edema.  No erythema.  Neurological: Grossly intact via light touch  Musculoskeletal Exam: Improved tenderness to palpation noted to the plantar fascia left  Radiographic Exam LT foot 04/28/2024:  Normal osseous mineralization. Joint spaces preserved.  No fractures or osseous irregularities noted.  Impression: Negative  Assessment/Plan of Care: 1.  Plantar fasciitis left 2.  Metatarsalgia left 3.  Onychomycosis of toenails left 1-5 4.  Tinea pedis left  -Patient evaluated.  -Overall improvement.  Continue to refrain from going barefoot and wear good supportive tennis shoes and sneakers even around the house.  She is currently wearing some OOFOS slides at home.  Continue -Prescription for Lotrisone  cream -Return to clinic PRN      Thresa EMERSON Sar, DPM [facility]  Dr. Thresa EMERSON Sar, DPM    [address] [place], [place] [address]                Office [phone]  Fax [phone]

## 2024-06-22 ENCOUNTER — Ambulatory Visit
Admission: RE | Admit: 2024-06-22 | Discharge: 2024-06-22 | Disposition: A | Discharge status: Home / Self Care | Source: Ambulatory Visit | Attending: Infectious Diseases | Admitting: Infectious Diseases

## 2024-06-22 DIAGNOSIS — Z1231 Encounter for screening mammogram for malignant neoplasm of breast: Secondary | ICD-10-CM | POA: Insufficient documentation
